# Patient Record
Sex: Female | Born: 1970 | Race: Asian | Hispanic: No | Smoking: Never smoker
Health system: Southern US, Community
[De-identification: ages and names within clinical notes are randomized; demographics above are authoritative.]

## PROBLEM LIST (undated history)

## (undated) DIAGNOSIS — Z789 Other specified health status: Secondary | ICD-10-CM

## (undated) HISTORY — PX: TUBAL LIGATION: SHX77

## (undated) HISTORY — PX: BREAST EXCISIONAL BIOPSY: SUR124

---

## 1998-10-11 ENCOUNTER — Other Ambulatory Visit: Admission: RE | Admit: 1998-10-11 | Discharge: 1998-10-11 | Payer: Self-pay | Admitting: Obstetrics and Gynecology

## 1999-10-17 ENCOUNTER — Other Ambulatory Visit: Admission: RE | Admit: 1999-10-17 | Discharge: 1999-10-17 | Payer: Self-pay | Admitting: Obstetrics and Gynecology

## 2002-01-20 ENCOUNTER — Other Ambulatory Visit: Admission: RE | Admit: 2002-01-20 | Discharge: 2002-01-20 | Payer: Self-pay | Admitting: Obstetrics and Gynecology

## 2002-06-30 ENCOUNTER — Other Ambulatory Visit: Admission: RE | Admit: 2002-06-30 | Discharge: 2002-06-30 | Payer: Self-pay | Admitting: Obstetrics and Gynecology

## 2003-02-12 ENCOUNTER — Other Ambulatory Visit: Admission: RE | Admit: 2003-02-12 | Discharge: 2003-02-12 | Payer: Self-pay | Admitting: Obstetrics and Gynecology

## 2004-02-24 ENCOUNTER — Inpatient Hospital Stay (HOSPITAL_COMMUNITY): Admission: AD | Admit: 2004-02-24 | Discharge: 2004-02-26 | Payer: Self-pay | Admitting: Obstetrics and Gynecology

## 2004-03-23 ENCOUNTER — Other Ambulatory Visit: Admission: RE | Admit: 2004-03-23 | Discharge: 2004-03-23 | Payer: Self-pay | Admitting: Obstetrics and Gynecology

## 2005-04-05 ENCOUNTER — Other Ambulatory Visit: Admission: RE | Admit: 2005-04-05 | Discharge: 2005-04-05 | Payer: Self-pay | Admitting: Obstetrics and Gynecology

## 2007-07-25 ENCOUNTER — Ambulatory Visit (HOSPITAL_COMMUNITY): Admission: RE | Admit: 2007-07-25 | Discharge: 2007-07-25 | Payer: Self-pay | Admitting: Obstetrics and Gynecology

## 2011-01-16 NOTE — Op Note (Signed)
NAMEMATTHEW, PAIS            ACCOUNT NO.:  [id number]   MEDICAL RECORD NO.:  [id number]          PATIENT TYPE:  AMB   LOCATION:  SDC                           FACILITY:  [facility]   PHYSICIAN:  Miguel Aschoff, M.D.       DATE OF BIRTH:  September 05, 1970   DATE OF PROCEDURE:  07/25/2007  DATE OF DISCHARGE:                               OPERATIVE REPORT   PREOPERATIVE DIAGNOSES:  1. Menorrhagia.  2. ParaGard IUD present.  3. Desired sterilization.   FINAL DIAGNOSES:  1. Menorrhagia.  2. ParaGard IUD present.  3. Desired sterilization.   PROCEDURE:  Removal of ParaGard IUD, cervical dilatation, hysteroscopy,  NovaSure endometrial ablation, laparoscopic tubal sterilization using  cautery.   SURGEON:  Dr. Miguel Aschoff.   ANESTHESIA:  General.   COMPLICATIONS:  None.   JUSTIFICATION:  The patient is a 40 year old Asian female who has had  very heavy menses assisted with ParaGard IUD.  The patient would like  treatment for the heavy menses.  In addition, Summer Hayes has expressed prior  for permanent sterilization.  Because of these two problems, plan is for  the patient to undergo removal of her IUD, hysteroscopy followed by  NovaSure endometrial ablation and tubal sterilization.  The risks and  benefits of these procedures were discussed with the patient, and  informed consent has been obtained.   PROCEDURE:  The patient was taken to the operating room and placed in  supine position.  General anesthesia was administered without  difficulty.  Summer Hayes was then placed in the dorsal lithotomy position and  prepped and draped in the usual sterile fashion.  Bladder was  catheterized.  Examination under anesthesia revealed normal external  genitalia, normal Bartholin and Skene's glands, normal urethra.  The  vaginal vault was without lesions.  Uterus was noted be retroflexed,  globular and somewhat irregular in shape, consistent with what appeared  to be small uterine fibroids.  No adnexal masses were  noted.  At this  point, the speculum was placed in the vaginal vault.  The anterior  cervical lip was grasped with the tenaculum, and the endometrial cavity  was sounded.  The total length was 9 cm.  The cervical length was 4 cm  for a cavity length of 5 cm.  Once this was done, the cervix was dilated  using Pratt dilators, and then the diagnostic hysteroscope was advanced  through the endocervix.  No endocervical lesions were noted.  The  endometrial cavity was then visualized.  There did not appear to be any  endometrial polyps, submucous myomas noted.  Once this was done and the  prior ParaGard IUD was removed, the NovaSure endometrial ablation  instrument was introduced.  A cavity width of 4.6 cm was then found, and  then at 127 watts, a treatment cycle of 94 seconds was carried out  without difficulty.  On completion of the treatment cycle, the NovaSure  instrument was removed in toto, and at this point, the hysteroscope was  advanced back into the uterine cavity.  There appeared to be adequate  ablation with good coagulation of the cavity.  At this point, a Hulka  tenaculum was placed through the cervix, and attention was then directed  to the abdomen.  A small infraumbilical incision was made, a Veress  needle was inserted, and then the abdomen was insufflated with 3 liters  CO2.  Following the insufflation, the trocar to laparoscope was placed  followed by laparoscope itself.  Inspection again revealed the uterus to  be globular, slightly irregular with small uterine fibroids.  The tubes  were normal along their course.  The ovaries were normal.  There were no  lesions noted in the cul-de-sac.  The round ligaments were unremarkable.  No hernias were noted.  The liver surface was inspected and was noted to  be within normal limits.  The gallbladder was visualized and appeared to  be normal.  There were no abnormalities noted within the abdomen.  At  this point, through the operating  channel of the laparoscope, bipolar  cautery forceps were introduced.  The midportion of each tube was then  identified and cauterized for approximately 3 cm of the midportion.  After cauterization was carried out, laparoscopic scissors were  introduced, and tubes were divided, and this was done with good  hemostasis.  After this was completed, the laparoscope was removed.  The  CO2 was allowed to escape.  The small infraumbilical incision was closed  using subcuticular 4-0 Vicryl and the port site injected with 5 mL of  0.25% Marcaine.  The estimated blood loss was minimal at the time of the  hysteroscopy and was approximately 10-20 mL.  The patient was then  reversed from the anesthetic and was brought to the recovery room in  satisfactory condition.   Plan is for the patient to be discharged home.  Her medications for home  include Tylox 1 every 3 hours as needed for pain, doxycycline 100 mg  twice a day for 3 days.  Summer Hayes is instructed to leave nothing in the  vagina for 2 weeks, to call for any problems such as fever, pain or  heavy bleeding.  Summer Hayes will be seen back in 4 weeks for follow-up  examination.      Miguel Aschoff, M.D.  Electronically Signed     AR/MEDQ  D:  07/25/2007  T:  07/26/2007  Job:  [id number]

## 2011-06-12 LAB — HCG, SERUM, QUALITATIVE: Preg, Serum: NEGATIVE

## 2011-06-12 LAB — CBC
HCT: 36.5
Hemoglobin: 12.5
MCHC: 34.2
MCV: 85.3
Platelets: 254
RBC: 4.28
RDW: 13.2
WBC: 5

## 2011-06-12 LAB — SEDIMENTATION RATE: Sed Rate: 28 — ABNORMAL HIGH

## 2012-09-05 ENCOUNTER — Other Ambulatory Visit: Payer: Self-pay | Admitting: Family Medicine

## 2012-09-05 DIAGNOSIS — R109 Unspecified abdominal pain: Secondary | ICD-10-CM

## 2012-09-12 ENCOUNTER — Other Ambulatory Visit: Payer: Self-pay

## 2012-10-06 ENCOUNTER — Other Ambulatory Visit: Payer: Self-pay

## 2013-03-31 ENCOUNTER — Other Ambulatory Visit: Payer: Self-pay | Admitting: Family Medicine

## 2013-03-31 DIAGNOSIS — R109 Unspecified abdominal pain: Secondary | ICD-10-CM

## 2013-04-03 ENCOUNTER — Ambulatory Visit
Admission: RE | Admit: 2013-04-03 | Discharge: 2013-04-03 | Disposition: A | Discharge status: Home / Self Care | Payer: Self-pay | Source: Ambulatory Visit | Attending: Family Medicine | Admitting: Family Medicine

## 2013-04-03 DIAGNOSIS — R109 Unspecified abdominal pain: Secondary | ICD-10-CM

## 2014-07-30 IMAGING — US US PELVIS COMPLETE
1 series · 13 of 25 positions shown · non-contrast
Comparison: None

CLINICAL DATA: Left lower quadrant pain.  Vaginal bleeding.
History of tubal ligation.  LMP 5 years prior to this exam due to
implant.



[Series 1: us pelvis complete · 0.35mm/px · 13 of 40 slices shown]
[im 1/40]
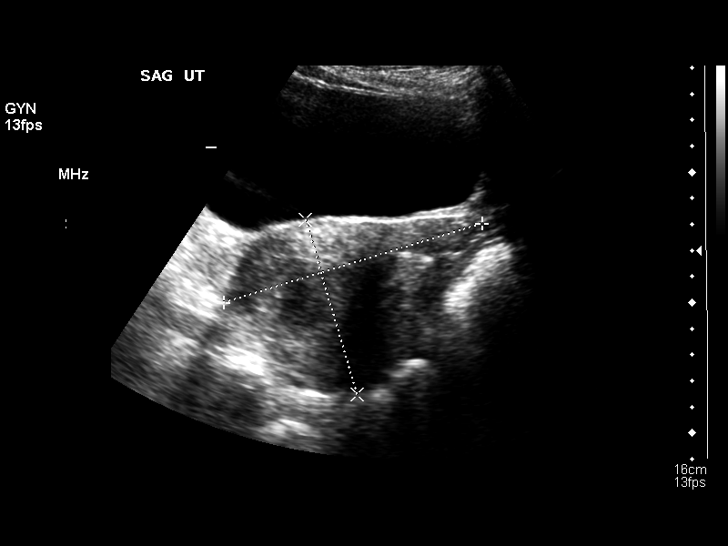
[im 4/40]
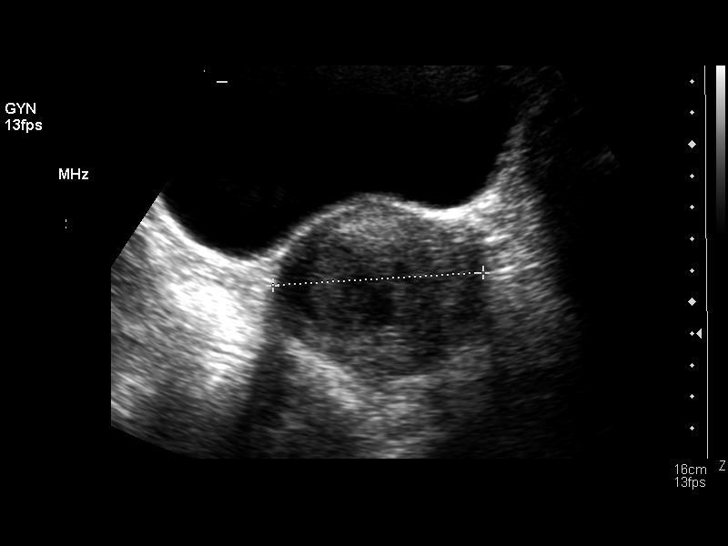
[im 7/40]
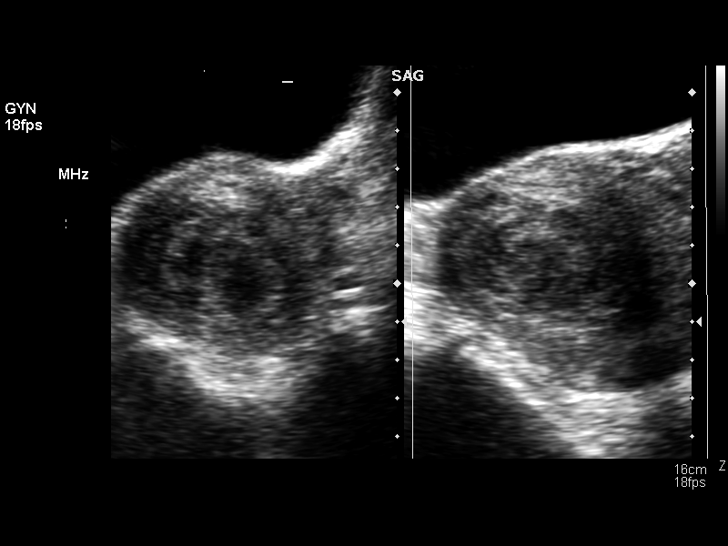
[im 10/40]
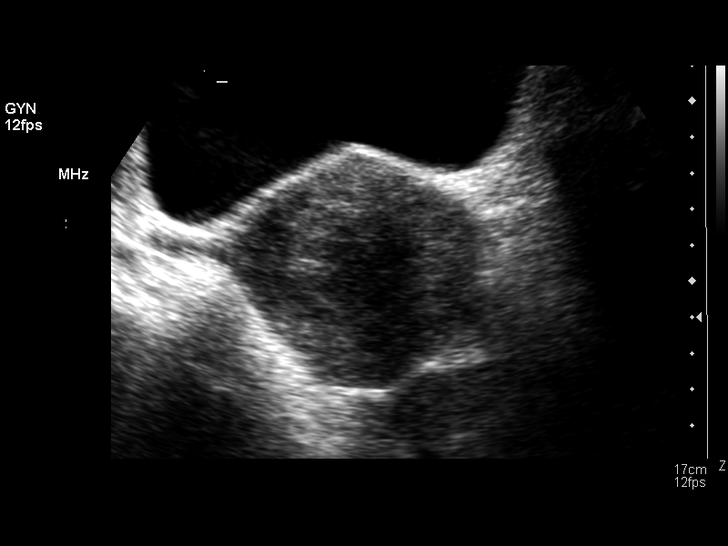
[im 14/40]
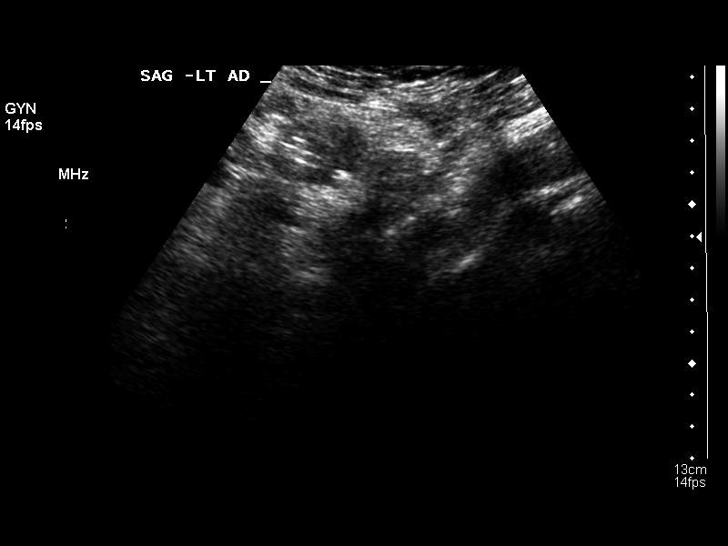
[im 17/40]
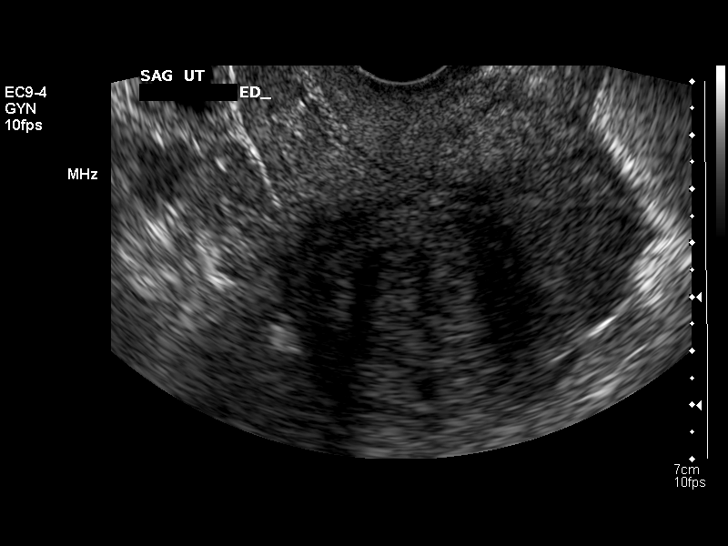
[im 20/40]
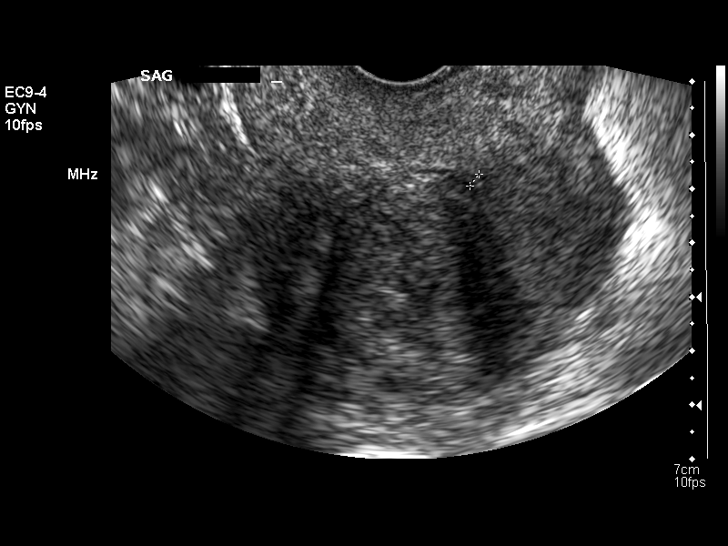
[im 23/40]
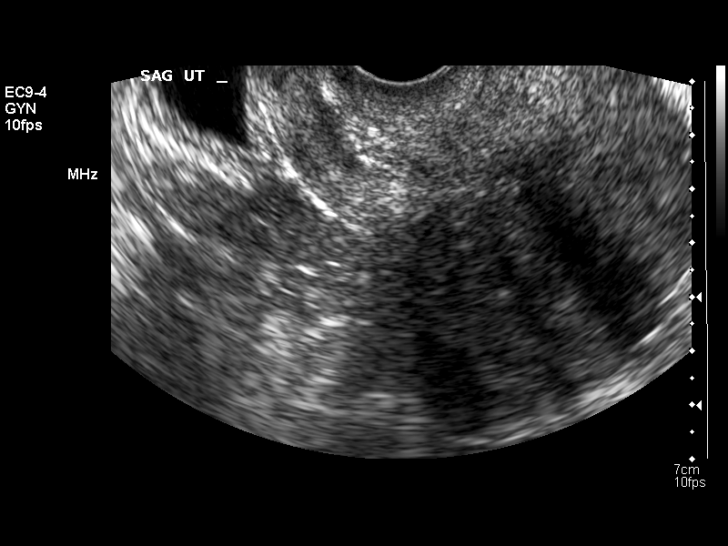
[im 27/40]
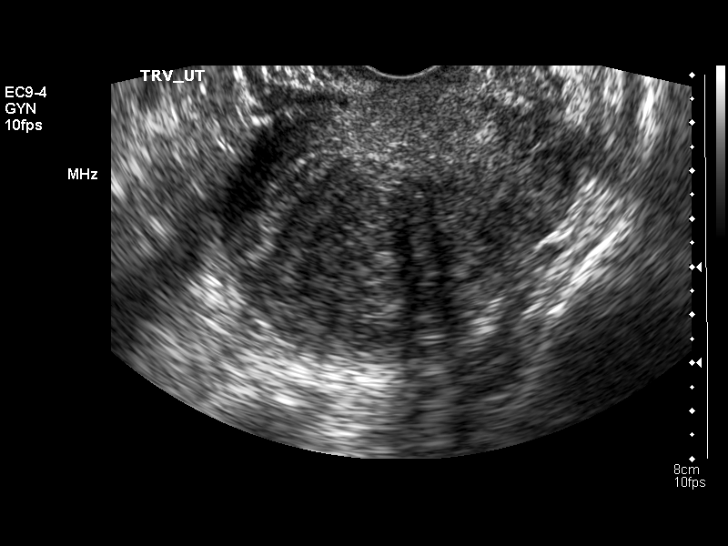
[im 30/40]
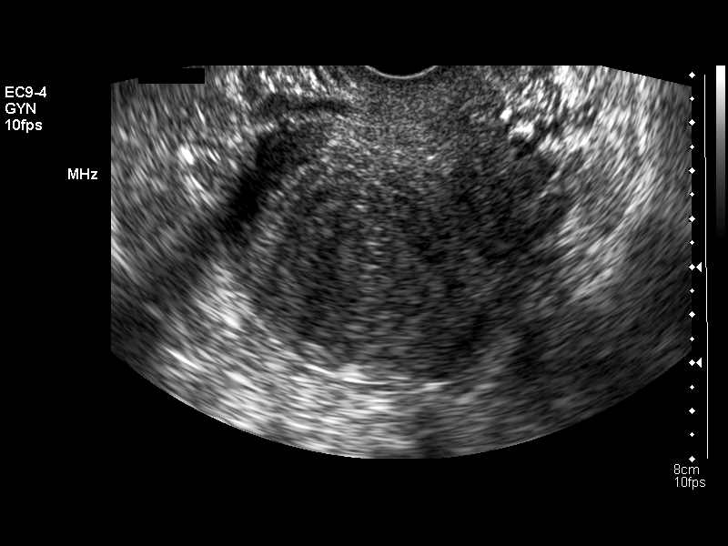
[im 33/40]
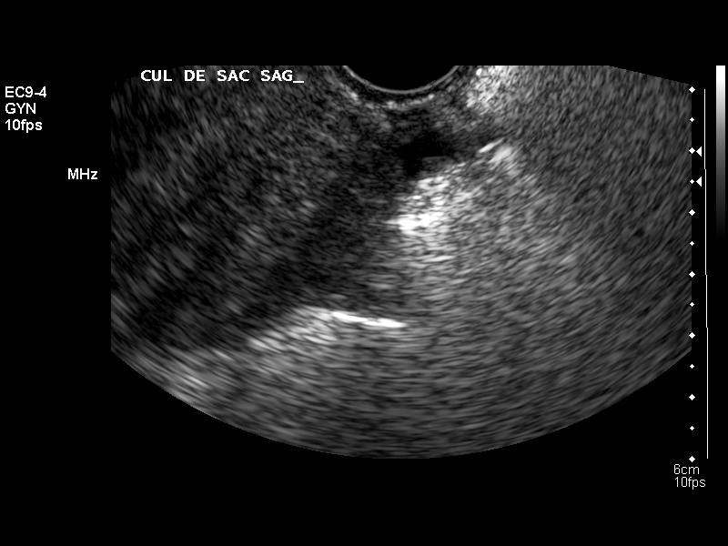
[im 36/40]
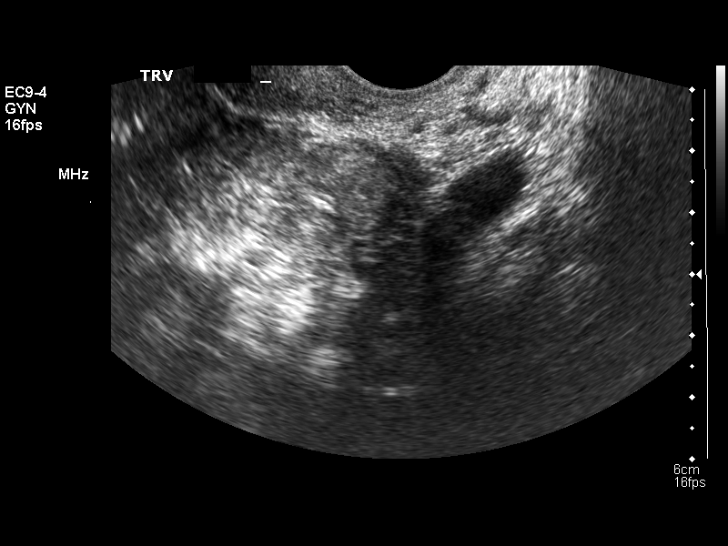
[im 40/40]
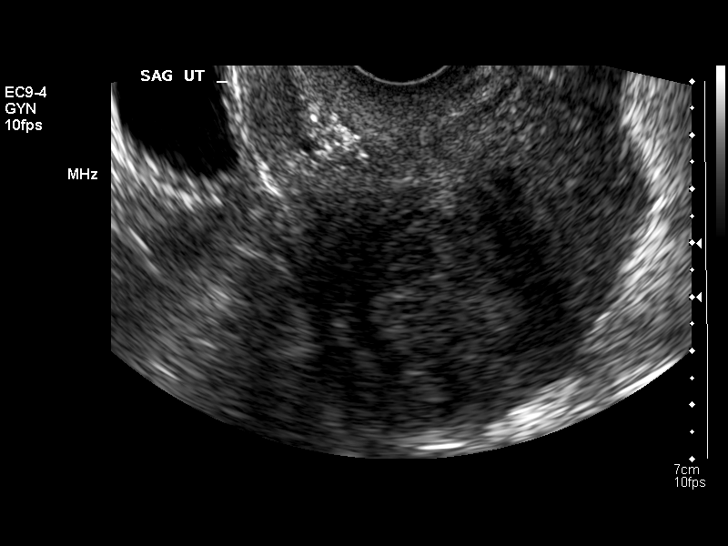

[13 of 25 positions shown; findings below may reference images not displayed]

FINDINGS: Uterus: Is retroverted and retroflexed and demonstrates a sagittal
length of 8.5 cm, depth of 7.3 cm and width of 7.2 cm.  A focal
fibroid is identified in the right anterolateral upper uterine
segment measuring 5.3 x 4.5 x 4.6 cm. This is primarily mural but
has a small subserosal component and deviates the endometrial canal
posteriorly which may indicate a minimal submucosal component as
well. No other mural abnormalities are seen.

Endometrium: A trace of fluid is noted in the fundal portion of the
endometrial canal and outlines a thin single layer of the
endometrium measuring 2 mm.

Right ovary:  Is not seen with confidence either transabdominally
or endovaginally

Left ovary: Is not seen with confidence either transabdominally or
endovaginally

Other findings: A trace of simple free fluid is noted in the cul-de-
sac.
IMPRESSION: Focal fibroid with sizes location as noted above.

Thin endometrium.

Non-visualized ovaries.

## 2014-07-30 IMAGING — US US ABDOMEN COMPLETE
1 series · 14 of 25 positions shown · non-contrast
Comparison: none

Abdominal ultrasound
HISTORY: Abdominal pain

[Series 1: us abdomen complete · 0.32mm/px · 14 of 70 slices shown]
[im 1/70]
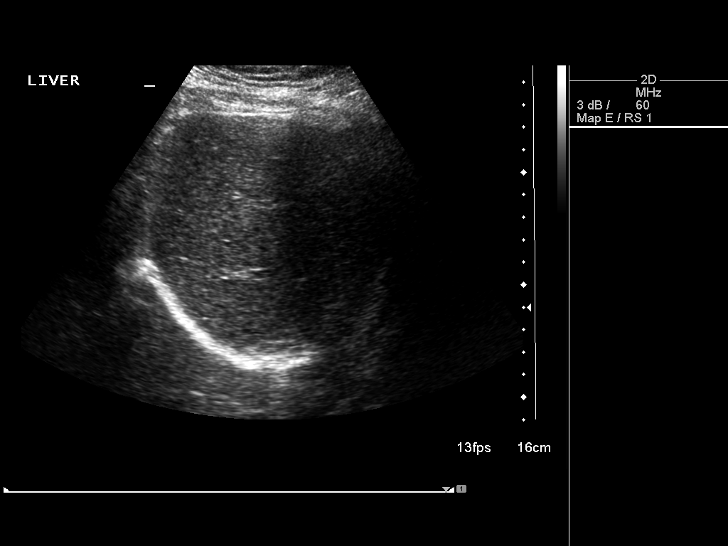
[im 6/70]
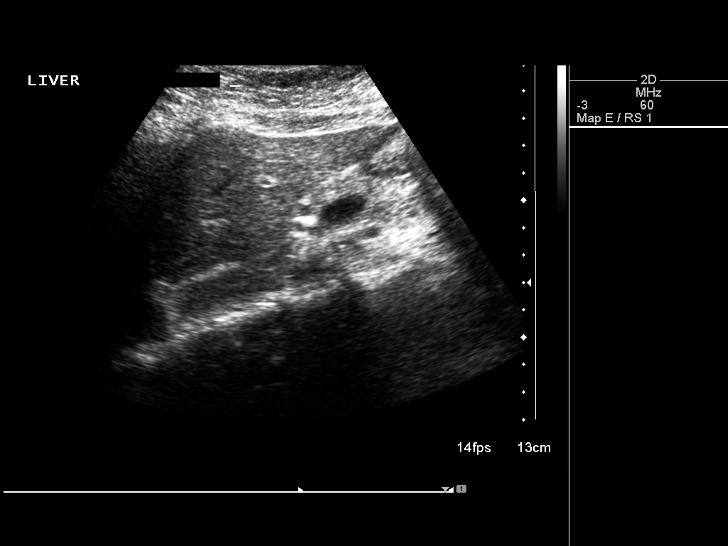
[im 12/70]
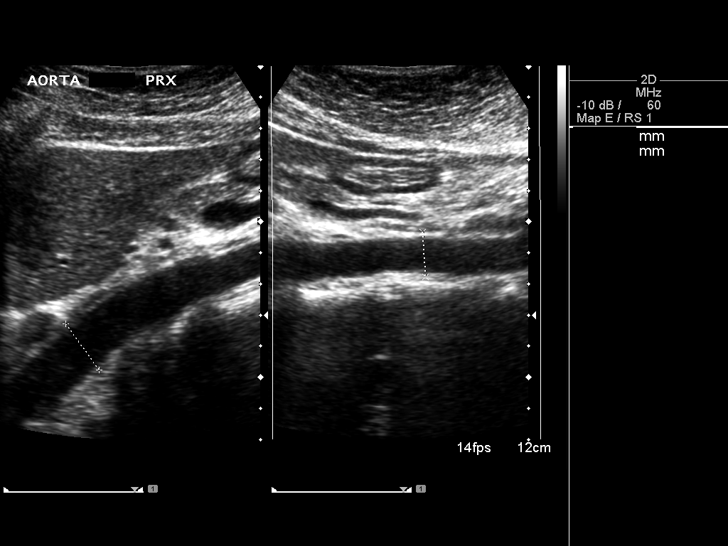
[im 18/70]
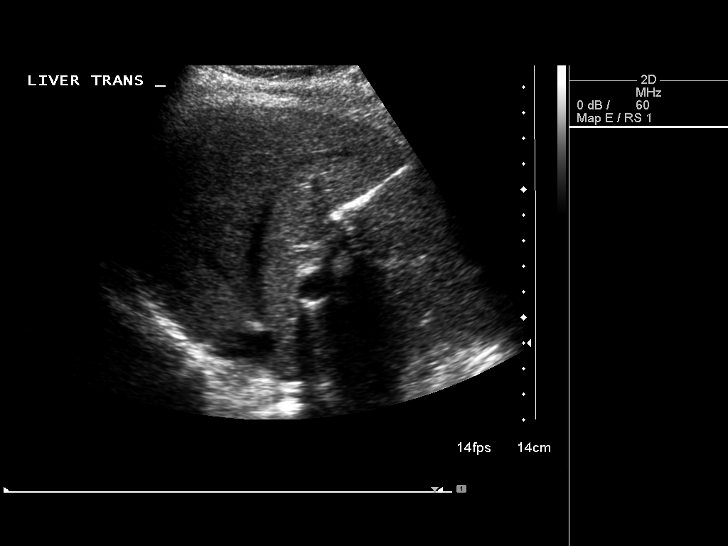
[im 24/70]
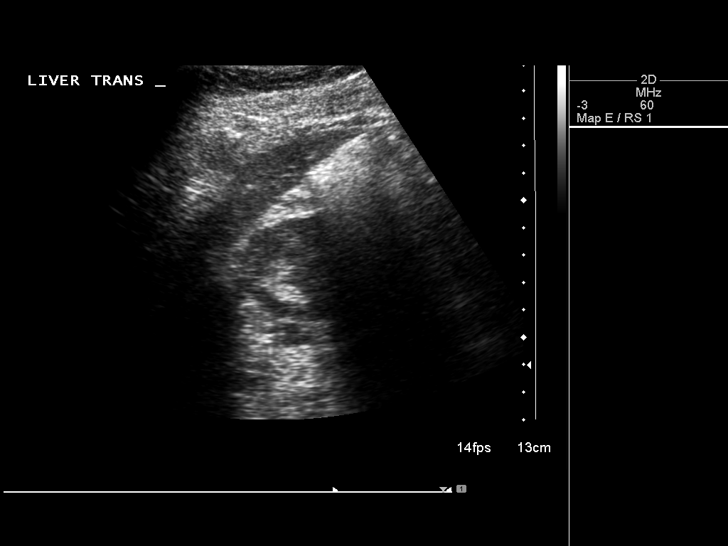
[im 26/70]
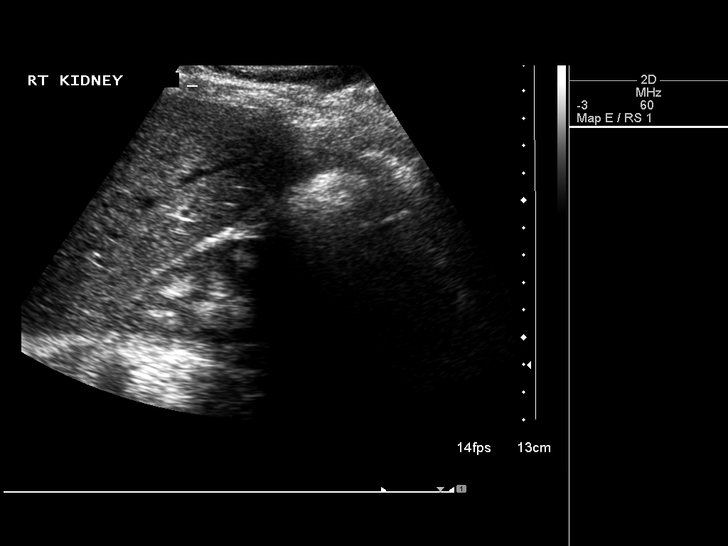
[im 32/70]
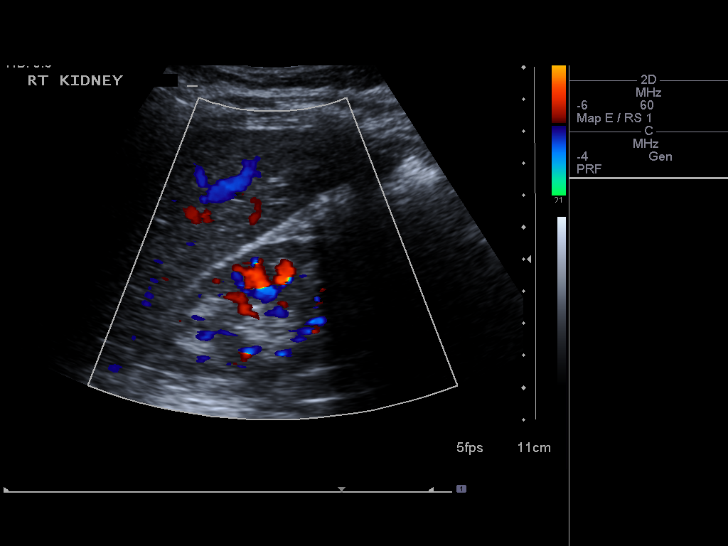
[im 38/70]
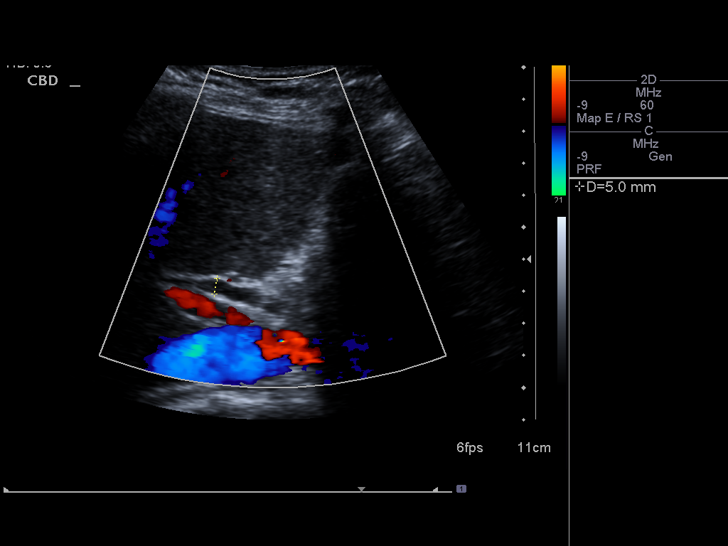
[im 44/70]
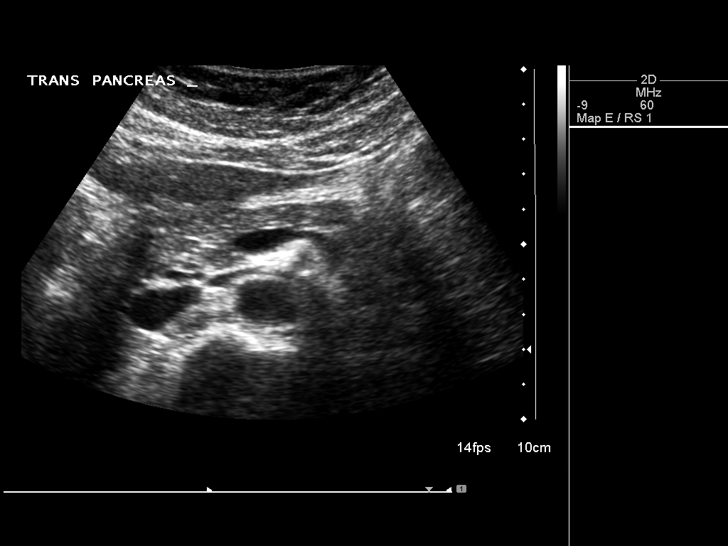
[im 47/70]
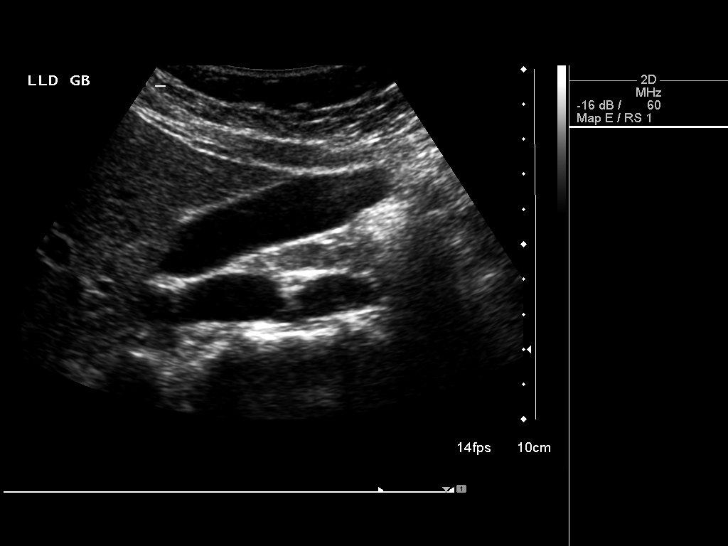
[im 52/70]
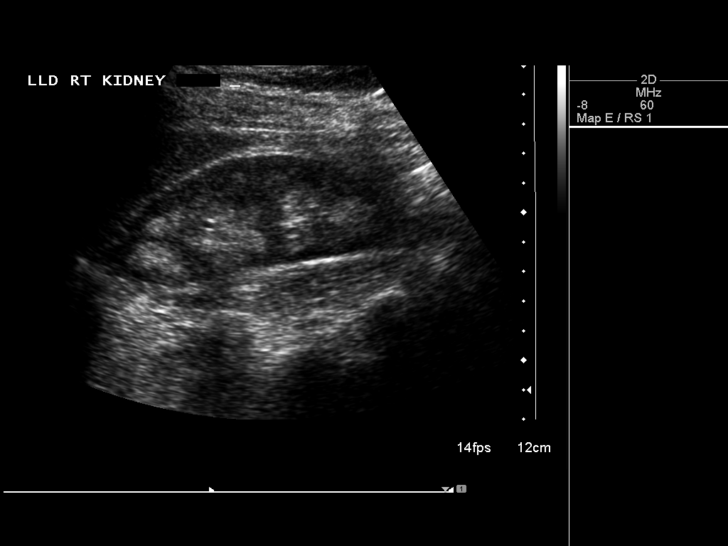
[im 58/70]
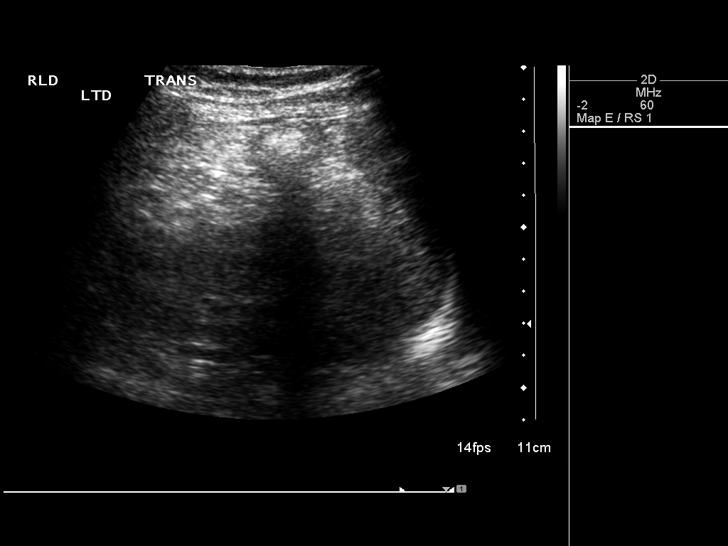
[im 64/70]
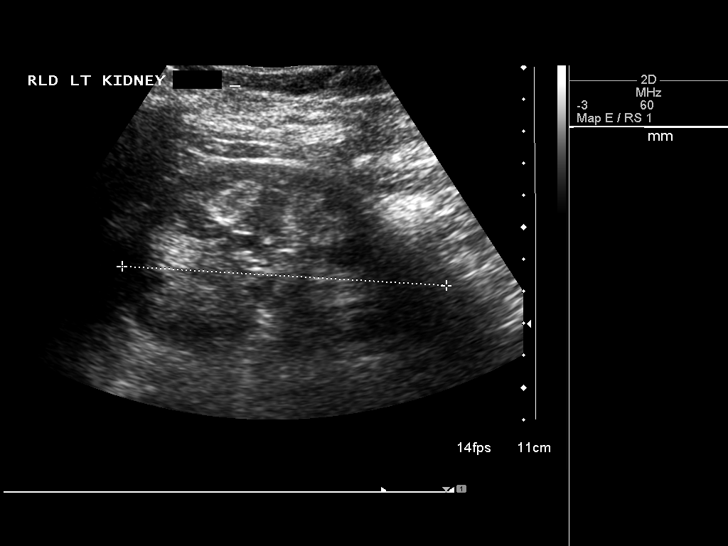
[im 70/70]
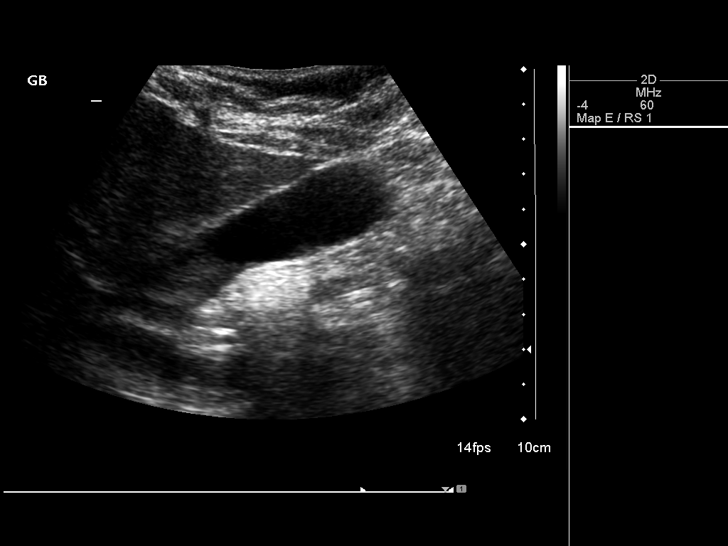

[14 of 25 positions shown; findings below may reference images not displayed]

FINDINGS: Gallbladder is visualized in multiple projections.
There are no gallstones, gallbladder wall thickening, or
pericholecystic fluid collection.  There is no intrahepatic, common
hepatic, or common bile duct dilatation.  The pancreas appears
normal.

No focal liver lesions are identified.  No splenic lesions are
appreciated.  Spleen is normal in size and contour.

Kidneys show no evidence of mass or obstructing focus.  The kidneys
show moderate sinus fat as well as what appears to be some
prominence in the infundibular regions, an appearance which can be
seen with medullary sponge kidney.  No well-defined calculi
identified in either kidney.

There is no ascites.  Aorta is nonaneurysmal.  Inferior vena cava
appears normal.
CONCLUSION: Kidneys have appearance which has been associated with
medullary sponge kidney.  No renal mass or obstructing focus
identified.

Study otherwise unremarkable.

## 2014-11-02 ENCOUNTER — Other Ambulatory Visit: Payer: Self-pay | Admitting: Obstetrics and Gynecology

## 2014-11-02 DIAGNOSIS — R928 Other abnormal and inconclusive findings on diagnostic imaging of breast: Secondary | ICD-10-CM

## 2014-11-15 ENCOUNTER — Other Ambulatory Visit: Payer: Self-pay | Admitting: Obstetrics and Gynecology

## 2014-11-15 ENCOUNTER — Ambulatory Visit
Admission: RE | Admit: 2014-11-15 | Discharge: 2014-11-15 | Disposition: A | Discharge status: Home / Self Care | Payer: Managed Care, Other (non HMO) | Source: Ambulatory Visit | Attending: Obstetrics and Gynecology | Admitting: Obstetrics and Gynecology

## 2014-11-15 DIAGNOSIS — R921 Mammographic calcification found on diagnostic imaging of breast: Secondary | ICD-10-CM

## 2014-11-15 DIAGNOSIS — R928 Other abnormal and inconclusive findings on diagnostic imaging of breast: Secondary | ICD-10-CM

## 2014-11-17 ENCOUNTER — Other Ambulatory Visit: Payer: Self-pay | Admitting: Obstetrics and Gynecology

## 2014-11-17 DIAGNOSIS — R921 Mammographic calcification found on diagnostic imaging of breast: Secondary | ICD-10-CM

## 2014-11-29 ENCOUNTER — Other Ambulatory Visit: Payer: Self-pay | Admitting: Obstetrics and Gynecology

## 2014-11-29 ENCOUNTER — Ambulatory Visit
Admission: RE | Admit: 2014-11-29 | Discharge: 2014-11-29 | Disposition: A | Discharge status: Home / Self Care | Payer: Managed Care, Other (non HMO) | Source: Ambulatory Visit | Attending: Obstetrics and Gynecology | Admitting: Obstetrics and Gynecology

## 2014-11-29 DIAGNOSIS — R921 Mammographic calcification found on diagnostic imaging of breast: Secondary | ICD-10-CM

## 2014-11-29 HISTORY — PX: BREAST BIOPSY: SHX20

## 2015-11-07 ENCOUNTER — Other Ambulatory Visit: Payer: Self-pay | Admitting: Obstetrics and Gynecology

## 2015-11-08 LAB — CYTOLOGY - PAP

## 2015-11-10 ENCOUNTER — Other Ambulatory Visit: Payer: Self-pay | Admitting: Obstetrics and Gynecology

## 2015-11-10 DIAGNOSIS — R928 Other abnormal and inconclusive findings on diagnostic imaging of breast: Secondary | ICD-10-CM

## 2015-11-24 ENCOUNTER — Ambulatory Visit
Admission: RE | Admit: 2015-11-24 | Discharge: 2015-11-24 | Disposition: A | Discharge status: Home / Self Care | Payer: Managed Care, Other (non HMO) | Source: Ambulatory Visit | Attending: Obstetrics and Gynecology | Admitting: Obstetrics and Gynecology

## 2015-11-24 ENCOUNTER — Other Ambulatory Visit: Payer: Self-pay | Admitting: Obstetrics and Gynecology

## 2015-11-24 DIAGNOSIS — R928 Other abnormal and inconclusive findings on diagnostic imaging of breast: Secondary | ICD-10-CM

## 2015-11-24 DIAGNOSIS — N6489 Other specified disorders of breast: Secondary | ICD-10-CM

## 2015-12-12 ENCOUNTER — Ambulatory Visit
Admission: RE | Admit: 2015-12-12 | Discharge: 2015-12-12 | Disposition: A | Discharge status: Home / Self Care | Payer: Managed Care, Other (non HMO) | Source: Ambulatory Visit | Attending: Obstetrics and Gynecology | Admitting: Obstetrics and Gynecology

## 2015-12-12 DIAGNOSIS — N6489 Other specified disorders of breast: Secondary | ICD-10-CM

## 2015-12-12 HISTORY — PX: BREAST BIOPSY: SHX20

## 2015-12-15 ENCOUNTER — Other Ambulatory Visit: Payer: Self-pay | Admitting: General Surgery

## 2015-12-15 DIAGNOSIS — N6091 Unspecified benign mammary dysplasia of right breast: Secondary | ICD-10-CM | POA: Insufficient documentation

## 2015-12-23 ENCOUNTER — Other Ambulatory Visit: Payer: Self-pay | Admitting: General Surgery

## 2015-12-23 DIAGNOSIS — N6091 Unspecified benign mammary dysplasia of right breast: Secondary | ICD-10-CM

## 2016-01-04 ENCOUNTER — Encounter (HOSPITAL_BASED_OUTPATIENT_CLINIC_OR_DEPARTMENT_OTHER): Payer: Self-pay | Admitting: *Deleted

## 2016-01-12 ENCOUNTER — Ambulatory Visit
Admission: RE | Admit: 2016-01-12 | Discharge: 2016-01-12 | Disposition: A | Discharge status: Home / Self Care | Payer: Managed Care, Other (non HMO) | Source: Ambulatory Visit | Attending: General Surgery | Admitting: General Surgery

## 2016-01-12 DIAGNOSIS — N6091 Unspecified benign mammary dysplasia of right breast: Secondary | ICD-10-CM

## 2016-01-12 NOTE — H&P (Signed)
Summer Hayes  Location: [facility] Patient #: [id number] DOB: 1970-12-11 Single / Language: [place] / Race: Asian Female        History of Present Illness   The patient is a 45 year old female who presents with a complaint of Breast problems. This is a 45 year old [place] female. Speaks English very well. Referred by Dr. Si Hayes at the breast center of [facility] for evaluation and management of atypical lobular hyperplasia right breast, upper outer quadrant. No PCP listed.  The patient has a history of a right breast needle core biopsy 1 year ago states this is in the lower outer quadrant and she was told this was benign. Review of the 2016 pathology reveals fibrocystic changes only. This year on her screening mammograms A so a new area of architectural distortion in the upper outer quadrant. This persisted on 3-D imaging. Image guided core biopsy shows atypical lobular hyperplasia, complex sclerosing lesion, usual ductal hyperplasia.  She was called and referred for surgical evaluation. She is fearful that there is an emergency because the appointment was made so soon.. I reassured her that there was not an emergency here.  Family history is negative for breast, ovarian, pancreatic or colon cancer. Mother living with end-stage renal disease hypertension and hyperlipidemia. Father died of primary liver cancer. Patient's past medical history is negative. She is healthy other than the biopsy of the right breast a year ago socially she is single but has 3 children. 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 Denies tobacco. Takes alcohol rarely.  We spent a very long time discussing the imaging findings, her histology report and its implications, and the techniques of surgery. I have advised her to undergo conservative excisional biopsy of this area and she agrees. She'll be scheduled for right breast lumpectomy with radioactive seed localization in the  near future. I discussed the indications, details, techniques, and numerous risk of the surgery with her. She is aware of the risk of bleeding, infection, further surgery if this turns out to be cancer, nerve damage with chronic pain or numbness, cosmetic deformity. She understands all of these issues. All her questions are answered. She agrees with this plan.   Other No pertinent past medical history  Past Surgical History  Breast Biopsy Right.  Diagnostic Studies History Colonoscopy never Mammogram within last year Pap Smear 1-5 years ago  Allergies  No Known Drug Allergies04/13/2017  Medication History  No Current Medications Medications Reconciled  Social History  Alcohol use Occasional alcohol use. Caffeine use Coffee. No drug use Tobacco use Never smoker.  Family History  Hypertension Mother.  Pregnancy / Birth History  Age at menarche 12 years. Gravida 5 Irregular periods Maternal age 69-25 Para 3    Review of Systems  General Not Present- Appetite Loss, Chills, Fatigue, Fever, Night Sweats, Weight Gain and Weight Loss. Skin Not Present- Change in Wart/Mole, Dryness, Hives, Jaundice, New Lesions, Non-Healing Wounds, Rash and Ulcer. HEENT Present- Sore Throat. Not Present- Earache, Hearing Loss, Hoarseness, Nose Bleed, Oral Ulcers, Ringing in the Ears, Seasonal Allergies, Sinus Pain, Visual Disturbances, Wears glasses/contact lenses and Yellow Eyes. Respiratory Not Present- Bloody sputum, Chronic Cough, Difficulty Breathing, Snoring and Wheezing. Breast Not Present- Breast Mass, Breast Pain, Nipple Discharge and Skin Changes. Cardiovascular Not Present- Chest Pain, Difficulty Breathing Lying Down, Leg Cramps, Palpitations, Rapid Heart Rate, Shortness of Breath and Swelling of Extremities. Gastrointestinal Not Present- Abdominal Pain, Bloating, Bloody Stool, Change in Bowel Habits, Chronic diarrhea, Constipation, Difficulty Swallowing,  Excessive gas, Gets full quickly at meals, Hemorrhoids, Indigestion, Nausea, Rectal Pain and Vomiting. Female Genitourinary Not Present- Frequency, Nocturia, Painful Urination, Pelvic Pain and Urgency. Musculoskeletal Not Present- Back Pain, Joint Pain, Joint Stiffness, Muscle Pain, Muscle Weakness and Swelling of Extremities. Neurological Not Present- Decreased Memory, Fainting, Headaches, Numbness, Seizures, Tingling, Tremor, Trouble walking and Weakness. Psychiatric Not Present- Anxiety, Bipolar, Change in Sleep Pattern, Depression, Fearful and Frequent crying. Endocrine Not Present- Cold Intolerance, Excessive Hunger, Hair Changes, Heat Intolerance, Hot flashes and New Diabetes. Hematology Not Present- Easy Bruising, Excessive bleeding, Gland problems, HIV and Persistent Infections.  Vitals  Weight: 108 lb Height: 56.5in Body Surface Area: 1.38 m Body Mass Index: 23.79 kg/m  Temp.: 98.31F  Pulse: 76 (Regular)  BP: 118/76 (Sitting, Left Arm, Standard)    Physical Exam General Mental Status-Alert. General Appearance-Consistent with stated age. Hydration-Well hydrated. Voice-Normal.  Head and Neck Head-normocephalic, atraumatic with no lesions or palpable masses. Trachea-midline. Thyroid Gland Characteristics - normal size and consistency.  Eye Eyeball - Bilateral-Extraocular movements intact. Sclera/Conjunctiva - Bilateral-No scleral icterus.  Chest and Lung Exam Chest and lung exam reveals -quiet, even and easy respiratory effort with no use of accessory muscles and on auscultation, normal breath sounds, no adventitious sounds and normal vocal resonance. Inspection Chest Wall - Normal. Back - normal.  Breast Note: Breasts are small to medium size. Nipple and areolar complexes looked normal. Fresh needle biopsy scar right breast upper outer quadrant. No palpable mass. Needle biopsy scar right breast lower outer quadrant. No other skin change  either breast. No palpable mass in either breast. No axillary adenopathy on either side.   Cardiovascular Cardiovascular examination reveals -normal heart sounds, regular rate and rhythm with no murmurs and normal pedal pulses bilaterally.  Abdomen Inspection Inspection of the abdomen reveals - No Hernias. Skin - Scar - no surgical scars. Palpation/Percussion Palpation and Percussion of the abdomen reveal - Soft, Non Tender, No Rebound tenderness, No Rigidity (guarding) and No hepatosplenomegaly. Auscultation Auscultation of the abdomen reveals - Bowel sounds normal.  Neurologic Neurologic evaluation reveals -alert and oriented x 3 with no impairment of recent or remote memory. Mental Status-Normal.  Musculoskeletal Normal Exam - Left-Upper Extremity Strength Normal and Lower Extremity Strength Normal. Normal Exam - Right-Upper Extremity Strength Normal and Lower Extremity Strength Normal.  Lymphatic Head & Neck  General Head & Neck Lymphatics: Bilateral - Description - Normal. Axillary  General Axillary Region: Bilateral - Description - Normal. Tenderness - Non Tender. Femoral & Inguinal  Generalized Femoral & Inguinal Lymphatics: Bilateral - Description - Normal. Tenderness - Non Tender.    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 This is probably not a breast cancer, but there is a 10% chance that you may have an early in situ cancer I have recommended that this area be conservatively excised with a right breast lumpectomy with radioactive seed localization I've discussed the indications, techniques, and numerous risk of the surgery with you.  You will be scheduled for a right breast lumpectomy with radioactive seed  localization in the near future.    Angelia MouldHaywood M. Derrell LollingIngram, M.D., [facility] General and Minimally invasive Surgery Breast and Colorectal Surgery Office:   [phone] Pager:   [phone]

## 2016-01-12 NOTE — Progress Notes (Signed)
Pt in to get Boost Breeze for surgery in AM.  Instructions reviewed.

## 2016-01-13 ENCOUNTER — Encounter (HOSPITAL_BASED_OUTPATIENT_CLINIC_OR_DEPARTMENT_OTHER): Payer: Self-pay

## 2016-01-13 ENCOUNTER — Ambulatory Visit (HOSPITAL_BASED_OUTPATIENT_CLINIC_OR_DEPARTMENT_OTHER): Payer: Managed Care, Other (non HMO) | Admitting: Anesthesiology

## 2016-01-13 ENCOUNTER — Ambulatory Visit (HOSPITAL_BASED_OUTPATIENT_CLINIC_OR_DEPARTMENT_OTHER)
Admission: RE | Admit: 2016-01-13 | Discharge: 2016-01-13 | Disposition: A | Discharge status: Home / Self Care | Payer: Managed Care, Other (non HMO) | Source: Ambulatory Visit | Attending: General Surgery | Admitting: General Surgery

## 2016-01-13 ENCOUNTER — Encounter (HOSPITAL_BASED_OUTPATIENT_CLINIC_OR_DEPARTMENT_OTHER)
Admission: RE | Disposition: A | Discharge status: Home / Self Care | Payer: Self-pay | Source: Ambulatory Visit | Attending: General Surgery

## 2016-01-13 ENCOUNTER — Ambulatory Visit
Admission: RE | Admit: 2016-01-13 | Discharge: 2016-01-13 | Disposition: A | Discharge status: Home / Self Care | Payer: Managed Care, Other (non HMO) | Source: Ambulatory Visit | Attending: General Surgery | Admitting: General Surgery

## 2016-01-13 DIAGNOSIS — N6091 Unspecified benign mammary dysplasia of right breast: Secondary | ICD-10-CM | POA: Diagnosis present

## 2016-01-13 HISTORY — DX: Other specified health status: Z78.9

## 2016-01-13 HISTORY — PX: HIGH RISK BREAST EXCISION: SHX6773

## 2016-01-13 HISTORY — PX: BREAST LUMPECTOMY WITH RADIOACTIVE SEED LOCALIZATION: SHX6424

## 2016-01-13 SURGERY — BREAST LUMPECTOMY WITH RADIOACTIVE SEED LOCALIZATION
Anesthesia: General | Site: Breast | Laterality: Right

## 2016-01-13 MED ORDER — EPHEDRINE SULFATE-NACL 50-0.9 MG/10ML-% IV SOSY
PREFILLED_SYRINGE | INTRAVENOUS | Status: DC | PRN
  Administered 2016-01-13: 10 mg via INTRAVENOUS

## 2016-01-13 MED ORDER — CEFAZOLIN SODIUM-DEXTROSE 2-4 GM/100ML-% IV SOLN
2.0000 g | INTRAVENOUS | Status: AC
  Administered 2016-01-13: 2 g via INTRAVENOUS

## 2016-01-13 MED ORDER — FENTANYL CITRATE (PF) 100 MCG/2ML IJ SOLN
50.0000 ug | INTRAMUSCULAR | Status: DC | PRN
  Administered 2016-01-13: 50 ug via INTRAVENOUS

## 2016-01-13 MED ORDER — PROPOFOL 10 MG/ML IV BOLUS
INTRAVENOUS | Status: AC
  Filled 2016-01-13: qty 20

## 2016-01-13 MED ORDER — OXYCODONE HCL 5 MG/5ML PO SOLN: 5.0000 mg | Freq: Once | ORAL | Status: DC | PRN

## 2016-01-13 MED ORDER — DEXAMETHASONE SODIUM PHOSPHATE 10 MG/ML IJ SOLN
INTRAMUSCULAR | Status: AC
  Filled 2016-01-13: qty 1

## 2016-01-13 MED ORDER — MIDAZOLAM HCL 2 MG/2ML IJ SOLN
1.0000 mg | INTRAMUSCULAR | Status: DC | PRN
  Administered 2016-01-13: 2 mg via INTRAVENOUS

## 2016-01-13 MED ORDER — GLYCOPYRROLATE 0.2 MG/ML IJ SOLN: 0.2000 mg | Freq: Once | INTRAMUSCULAR | Status: DC | PRN

## 2016-01-13 MED ORDER — OXYCODONE HCL 5 MG PO TABS: 5.0000 mg | ORAL_TABLET | Freq: Once | ORAL | Status: DC | PRN

## 2016-01-13 MED ORDER — BUPIVACAINE-EPINEPHRINE (PF) 0.5% -1:200000 IJ SOLN
INTRAMUSCULAR | Status: DC | PRN
  Administered 2016-01-13: 6 mL

## 2016-01-13 MED ORDER — PROPOFOL 10 MG/ML IV BOLUS
INTRAVENOUS | Status: DC | PRN
Start: 2016-01-13 — End: 2016-01-13
  Administered 2016-01-13: 150 mg via INTRAVENOUS

## 2016-01-13 MED ORDER — CHLORHEXIDINE GLUCONATE 4 % EX LIQD: 1.0000 "application " | Freq: Once | CUTANEOUS | Status: DC

## 2016-01-13 MED ORDER — DEXAMETHASONE SODIUM PHOSPHATE 4 MG/ML IJ SOLN
INTRAMUSCULAR | Status: DC | PRN
  Administered 2016-01-13: 10 mg via INTRAVENOUS

## 2016-01-13 MED ORDER — LIDOCAINE 2% (20 MG/ML) 5 ML SYRINGE
INTRAMUSCULAR | Status: AC
  Filled 2016-01-13: qty 5

## 2016-01-13 MED ORDER — SCOPOLAMINE 1 MG/3DAYS TD PT72: 1.0000 | MEDICATED_PATCH | Freq: Once | TRANSDERMAL | Status: DC | PRN

## 2016-01-13 MED ORDER — MIDAZOLAM HCL 2 MG/2ML IJ SOLN
INTRAMUSCULAR | Status: AC
  Filled 2016-01-13: qty 2

## 2016-01-13 MED ORDER — ONDANSETRON HCL 4 MG/2ML IJ SOLN
INTRAMUSCULAR | Status: DC | PRN
  Administered 2016-01-13: 4 mg via INTRAVENOUS

## 2016-01-13 MED ORDER — LACTATED RINGERS IV SOLN
INTRAVENOUS | Status: DC
  Administered 2016-01-13: 08:00:00 via INTRAVENOUS
  Administered 2016-01-13: 10 mL/h via INTRAVENOUS

## 2016-01-13 MED ORDER — EPHEDRINE 5 MG/ML INJ
INTRAVENOUS | Status: AC
  Filled 2016-01-13: qty 10

## 2016-01-13 MED ORDER — LIDOCAINE 2% (20 MG/ML) 5 ML SYRINGE
INTRAMUSCULAR | Status: DC | PRN
  Administered 2016-01-13: 60 mg via INTRAVENOUS

## 2016-01-13 MED ORDER — HYDROCODONE-ACETAMINOPHEN 5-325 MG PO TABS: 1.0000 | ORAL_TABLET | Freq: Four times a day (QID) | ORAL | Status: AC | PRN

## 2016-01-13 MED ORDER — ONDANSETRON HCL 4 MG/2ML IJ SOLN
INTRAMUSCULAR | Status: AC
  Filled 2016-01-13: qty 2

## 2016-01-13 MED ORDER — FENTANYL CITRATE (PF) 100 MCG/2ML IJ SOLN
INTRAMUSCULAR | Status: AC
  Filled 2016-01-13: qty 2

## 2016-01-13 MED ORDER — FENTANYL CITRATE (PF) 100 MCG/2ML IJ SOLN
25.0000 ug | INTRAMUSCULAR | Status: DC | PRN
  Administered 2016-01-13: 25 ug via INTRAVENOUS
  Administered 2016-01-13: 50 ug via INTRAVENOUS

## 2016-01-13 MED ORDER — CEFAZOLIN SODIUM-DEXTROSE 2-4 GM/100ML-% IV SOLN
INTRAVENOUS | Status: AC
  Filled 2016-01-13: qty 100

## 2016-01-13 MED ORDER — ONDANSETRON HCL 4 MG/2ML IJ SOLN: 4.0000 mg | Freq: Four times a day (QID) | INTRAMUSCULAR | Status: DC | PRN

## 2016-01-13 SURGICAL SUPPLY — 62 items
ADH SKN CLS APL DERMABOND .7 (GAUZE/BANDAGES/DRESSINGS) ×1
APL SKNCLS STERI-STRIP NONHPOA (GAUZE/BANDAGES/DRESSINGS)
APPLIER CLIP 9.375 MED OPEN (MISCELLANEOUS)
APR CLP MED 9.3 20 MLT OPN (MISCELLANEOUS)
BENZOIN TINCTURE PRP APPL 2/3 (GAUZE/BANDAGES/DRESSINGS) IMPLANT
BINDER BREAST LRG (GAUZE/BANDAGES/DRESSINGS) IMPLANT
BINDER BREAST MEDIUM (GAUZE/BANDAGES/DRESSINGS) ×1 IMPLANT
BINDER BREAST XLRG (GAUZE/BANDAGES/DRESSINGS) IMPLANT
BINDER BREAST XXLRG (GAUZE/BANDAGES/DRESSINGS) IMPLANT
BLADE HEX COATED 2.75 (ELECTRODE) ×2 IMPLANT
BLADE SURG 10 STRL SS (BLADE) IMPLANT
BLADE SURG 15 STRL LF DISP TIS (BLADE) ×1 IMPLANT
BLADE SURG 15 STRL SS (BLADE) ×2
CANISTER SUC SOCK COL 7IN (MISCELLANEOUS) IMPLANT
CANISTER SUCT 1200ML W/VALVE (MISCELLANEOUS) ×2 IMPLANT
CHLORAPREP W/TINT 26ML (MISCELLANEOUS) ×2 IMPLANT
CLIP APPLIE 9.375 MED OPEN (MISCELLANEOUS) IMPLANT
COVER BACK TABLE 60X90IN (DRAPES) ×2 IMPLANT
COVER MAYO STAND STRL (DRAPES) ×2 IMPLANT
COVER PROBE W GEL 5X96 (DRAPES) ×2 IMPLANT
DECANTER SPIKE VIAL GLASS SM (MISCELLANEOUS) IMPLANT
DERMABOND ADVANCED (GAUZE/BANDAGES/DRESSINGS) ×1
DERMABOND ADVANCED .7 DNX12 (GAUZE/BANDAGES/DRESSINGS) ×1 IMPLANT
DEVICE DUBIN W/COMP PLATE 8390 (MISCELLANEOUS) ×2 IMPLANT
DRAPE LAPAROSCOPIC ABDOMINAL (DRAPES) ×2 IMPLANT
DRAPE UTILITY XL STRL (DRAPES) ×2 IMPLANT
DRSG PAD ABDOMINAL 8X10 ST (GAUZE/BANDAGES/DRESSINGS) ×1 IMPLANT
ELECT REM PT RETURN 9FT ADLT (ELECTROSURGICAL) ×2
ELECTRODE REM PT RTRN 9FT ADLT (ELECTROSURGICAL) ×1 IMPLANT
GLOVE BIO SURGEON STRL SZ7 (GLOVE) ×2 IMPLANT
GLOVE EUDERMIC 7 POWDERFREE (GLOVE) ×2 IMPLANT
GOWN STRL REUS W/ TWL LRG LVL3 (GOWN DISPOSABLE) ×1 IMPLANT
GOWN STRL REUS W/ TWL XL LVL3 (GOWN DISPOSABLE) ×1 IMPLANT
GOWN STRL REUS W/TWL LRG LVL3 (GOWN DISPOSABLE) ×2
GOWN STRL REUS W/TWL XL LVL3 (GOWN DISPOSABLE) ×2
ILLUMINATOR WAVEGUIDE N/F (MISCELLANEOUS) IMPLANT
KIT MARKER MARGIN INK (KITS) ×2 IMPLANT
LIGHT WAVEGUIDE WIDE FLAT (MISCELLANEOUS) ×1 IMPLANT
NDL HYPO 25X1 1.5 SAFETY (NEEDLE) ×1 IMPLANT
NEEDLE HYPO 25X1 1.5 SAFETY (NEEDLE) ×2 IMPLANT
NS IRRIG 1000ML POUR BTL (IV SOLUTION) ×2 IMPLANT
PACK BASIN DAY SURGERY FS (CUSTOM PROCEDURE TRAY) ×2 IMPLANT
PENCIL BUTTON HOLSTER BLD 10FT (ELECTRODE) ×2 IMPLANT
SHEET MEDIUM DRAPE 40X70 STRL (DRAPES) IMPLANT
SLEEVE SCD COMPRESS KNEE MED (MISCELLANEOUS) ×2 IMPLANT
SPONGE GAUZE 4X4 12PLY STER LF (GAUZE/BANDAGES/DRESSINGS) ×1 IMPLANT
SPONGE LAP 18X18 X RAY DECT (DISPOSABLE) IMPLANT
SPONGE LAP 4X18 X RAY DECT (DISPOSABLE) ×2 IMPLANT
STRIP CLOSURE SKIN 1/2X4 (GAUZE/BANDAGES/DRESSINGS) IMPLANT
SUT ETHILON 3 0 FSL (SUTURE) IMPLANT
SUT MNCRL AB 4-0 PS2 18 (SUTURE) ×2 IMPLANT
SUT SILK 2 0 SH (SUTURE) ×2 IMPLANT
SUT VIC AB 2-0 CT1 27 (SUTURE)
SUT VIC AB 2-0 CT1 TAPERPNT 27 (SUTURE) IMPLANT
SUT VIC AB 3-0 SH 27 (SUTURE)
SUT VIC AB 3-0 SH 27X BRD (SUTURE) IMPLANT
SUT VICRYL 3-0 CR8 SH (SUTURE) ×2 IMPLANT
SYRINGE 10CC LL (SYRINGE) ×2 IMPLANT
TOWEL OR 17X24 6PK STRL BLUE (TOWEL DISPOSABLE) ×2 IMPLANT
TOWEL OR NON WOVEN STRL DISP B (DISPOSABLE) IMPLANT
TUBE CONNECTING 20X1/4 (TUBING) ×2 IMPLANT
YANKAUER SUCT BULB TIP NO VENT (SUCTIONS) ×2 IMPLANT

## 2016-01-13 NOTE — Op Note (Signed)
Patient Name:           Summer Hayes   Date of Surgery:        01/13/2016  Pre op Diagnosis:      Atypical lobular hyperplasia right breast  Post op Diagnosis:    Same  Procedure:                 Right breast lumpectomy with radioactive seed localization and specimen mammogram and margin assessment  Surgeon:                     Edsel Petrin. Dalbert Batman, M.D., FACS  Assistant:                      OR staff  Operative Indications:    This is a 45 year old [name] female. Speaks English very well. Referred by Dr. Melanee Spry at the breast center of [place] for evaluation and management of atypical lobular hyperplasia right breast, upper outer quadrant.       The patient has a history of a right breast needle core biopsy 1 year ago states this is in the lower outer quadrant and she was told this was benign. Review of the 2016 pathology reveals fibrocystic changes only. This year on her screening mammograms A so a new area of architectural distortion in the upper outer quadrant. This persisted on 3-D imaging. Image guided core biopsy shows atypical lobular hyperplasia, complex sclerosing lesion, usual ductal hyperplasia.      She was called and referred for surgical evaluation.       Family history is negative for breast, ovarian, pancreatic or colon cancer.      We spent a very long time discussing the imaging findings, her histology report and its implications, and the techniques of surgery. I have advised her to undergo conservative excisional biopsy of this area and she agrees. She'll be scheduled for right breast lumpectomy with radioactive seed localization in the near future. I discussed the indications, details, techniques, and numerous risk of the surgery with her. She is aware of the risk of bleeding, infection, further surgery if this turns out to be cancer, nerve damage with chronic pain or numbness, cosmetic deformity. She understands all of these issues. All her questions are  answered. She agrees with this plan.  Operative Findings:       The radioactive seed was in the lateral right breast very close to the pectoralis muscle.  I was able to excise this through a lateral low axillary incision at the edge of the breast to hide the scar.  The specimen mammogram looked good containing both the seed and a marker clip.  I found the radioactive seed in the posterior edge of the specimen right on the pectoralis fashion.  I actually had to suture the seed back in place on the posterior margin.  I discussed this with pathology  Procedure in Detail:          Following the induction of general LMA anesthesia the patient's right breast was prepped and draped in a sterile fashion, surgical timeout was performed, intravenous antibiotics were given.  I used the neoprobe to identify the location of the radioactive seed which was really at about the 3:00 position and was about equidistant from her areola and her lateral breast edge.  Her areola was very small and not suitable for a circumareolar incision.  0.5% Marcaine with epinephrine was used as local infiltration anesthetic.  A curvilinear  incision was made laterally at the breast edge.  Using the lighted Invuity retractor I dissected medially until I could get past the radioactivity and then took the dissection down to the pectoralis fascia.  As I brought the specimen laterally I saw the seed embedded in the posterior margin and sutured this in place.  The specimen was marked with silk sutures.  I further resected the specimen and then marked it further with a 6 color ink kit.    Specimen mammogram looked good as described above in the specimen was marked and sent to the lab.  Hemostasis excellent and achieved with electrocautery.  Wound was irrigated with saline.  The deeper breast tissues were closed with 3-0 Vicryl sutures and skin closed with a running subcuticular 4-0 Monocryl and Dermabond.  Breast binder was placed and the patient taken  to PACU in stable condition.  EBL 15 mL.  Counts correct.  Complications none.     Edsel Petrin. Dalbert Batman, M.D., FACS General and Minimally Invasive Surgery Breast and Colorectal Surgery  01/13/2016 10:30 AM

## 2016-01-13 NOTE — Interval H&P Note (Signed)
History and Physical Interval Note:  01/13/2016 9:06 AM  Summer Hayes  has presented today for surgery, with the diagnosis of atypical lobular hyperplasia right breast, upper outer quadrant  The various methods of treatment have been discussed with the patient and family. After consideration of risks, benefits and other options for treatment, the patient has consented to  Procedure(s): RIGHT BREAST LUMPECTOMY WITH RADIOACTIVE SEED LOCALIZATION (Right) as a surgical intervention .  The patient's history has been reviewed, patient examined, no change in status, stable for surgery.  I have reviewed the patient's chart and labs.  Questions were answered to the patient's satisfaction.     Ernestene MentionINGRAM,Hitoshi Werts M

## 2016-01-13 NOTE — Anesthesia Procedure Notes (Signed)
Procedure Name: LMA Insertion Date/Time: 01/13/2016 9:46 AM Performed by: Gar GibbonKEETON, Bartley Vuolo S Pre-anesthesia Checklist: Patient identified, Emergency Drugs available, Suction available and Patient being monitored Patient Re-evaluated:Patient Re-evaluated prior to inductionOxygen Delivery Method: Circle System Utilized Preoxygenation: Pre-oxygenation with 100% oxygen Intubation Type: IV induction Ventilation: Mask ventilation without difficulty LMA: LMA inserted LMA Size: 3.0 Number of attempts: 1 Airway Equipment and Method: Bite block Placement Confirmation: positive ETCO2 Tube secured with: Tape Dental Injury: Teeth and Oropharynx as per pre-operative assessment

## 2016-01-13 NOTE — Transfer of Care (Signed)
Immediate Anesthesia Transfer of Care Note  Patient: Summer Hayes  Procedure(s) Performed: Procedure(s): RIGHT BREAST LUMPECTOMY WITH RADIOACTIVE SEED LOCALIZATION (Right)  Patient Location: PACU  Anesthesia Type:General  Level of Consciousness: awake, sedated and patient cooperative  Airway & Oxygen Therapy: Patient Spontanous Breathing and Patient connected to face mask oxygen  Post-op Assessment: Report given to RN and Post -op Vital signs reviewed and stable  Post vital signs: Reviewed and stable  Last Vitals:  Filed Vitals:   01/13/16 0751  BP: 126/84  Pulse: 73  Temp: 36.7 C  Resp: 16    Last Pain: There were no vitals filed for this visit.       Complications: No apparent anesthesia complications

## 2016-01-13 NOTE — Discharge Instructions (Signed)
[facility] °Office Phone Number [phone] ° °BREAST BIOPSY/ PARTIAL MASTECTOMY: POST OP INSTRUCTIONS ° °Always review your discharge instruction sheet given to you by the facility where your surgery was performed. ° °IF YOU HAVE DISABILITY OR FAMILY LEAVE FORMS, YOU MUST BRING THEM TO THE OFFICE FOR PROCESSING.  DO NOT GIVE THEM TO YOUR DOCTOR. ° °1. A prescription for pain medication may be given to you upon discharge.  Take your pain medication as prescribed, if needed.  If narcotic pain medicine is not needed, then you may take acetaminophen (Tylenol) or ibuprofen (Advil) as needed. °2. Take your usually prescribed medications unless otherwise directed °3. If you need a refill on your pain medication, please contact your pharmacy.  They will contact our office to request authorization.  Prescriptions will not be filled after 5pm or on week-ends. °4. You should eat very light the first 24 hours after surgery, such as soup, crackers, pudding, etc.  Resume your normal diet the day after surgery. °5. Most patients will experience some swelling and bruising in the breast.  Ice packs and a good support bra will help.  Swelling and bruising can take several days to resolve.  °6. It is common to experience some constipation if taking pain medication after surgery.  Increasing fluid intake and taking a stool softener will usually help or prevent this problem from occurring.  A mild laxative (Milk of Magnesia or Miralax) should be taken according to package directions if there are no bowel movements after 48 hours. °7. Unless discharge instructions indicate otherwise, you may remove your bandages 24-48 hours after surgery, and you may shower at that time.  You may have steri-strips (small skin tapes) in place directly over the incision.  These strips should be left on the skin for 7-10 days.  If your surgeon used skin glue on the incision, you may shower in 24 hours.  The glue will flake off over the  next 2-3 weeks.  Any sutures or staples will be removed at the office during your follow-up visit. °8. ACTIVITIES:  You may resume regular daily activities (gradually increasing) beginning the next day.  Wearing a good support bra or sports bra minimizes pain and swelling.  You may have sexual intercourse when it is comfortable. °a. You may drive when you no longer are taking prescription pain medication, you can comfortably wear a seatbelt, and you can safely maneuver your car and apply brakes. °b. RETURN TO WORK:  ______________________________________________________________________________________ °9. You should see your doctor in the office for a follow-up appointment approximately two weeks after your surgery.  Your doctor’s nurse will typically make your follow-up appointment when she calls you with your pathology report.  Expect your pathology report 2-3 business days after your surgery.  You may call to check if you do not hear from us after three days. °10. OTHER INSTRUCTIONS: _______________________________________________________________________________________________ _____________________________________________________________________________________________________________________________________ °_____________________________________________________________________________________________________________________________________ °_____________________________________________________________________________________________________________________________________ ° °WHEN TO CALL YOUR DOCTOR: °1. Fever over 101.0 °2. Nausea and/or vomiting. °3. Extreme swelling or bruising. °4. Continued bleeding from incision. °5. Increased pain, redness, or drainage from the incision. ° °The clinic staff is available to answer your questions during regular business hours.  Please don’t hesitate to call and ask to speak to one of the nurses for clinical concerns.  If you have a medical emergency, go to the nearest  emergency room or call 911.  A surgeon from [facility] is always on call at the hospital. ° °For further questions, please visit [email or web address]  ° ° ° °  Post Anesthesia Home Care Instructions ° °Activity: °Get plenty of rest for the remainder of the day. A responsible adult should stay with you for 24 hours following the procedure.  °For the next 24 hours, DO NOT: °-Drive a car °-Operate machinery °-Drink alcoholic beverages °-Take any medication unless instructed by your physician °-Make any legal decisions or sign important papers. ° °Meals: °Start with liquid foods such as gelatin or soup. Progress to regular foods as tolerated. Avoid greasy, spicy, heavy foods. If nausea and/or vomiting occur, drink only clear liquids until the nausea and/or vomiting subsides. Call your physician if vomiting continues. ° °Special Instructions/Symptoms: °Your throat may feel dry or sore from the anesthesia or the breathing tube placed in your throat during surgery. If this causes discomfort, gargle with warm salt water. The discomfort should disappear within 24 hours. ° °If you had a scopolamine patch placed behind your ear for the management of post- operative nausea and/or vomiting: ° °1. The medication in the patch is effective for 72 hours, after which it should be removed.  Wrap patch in a tissue and discard in the trash. Wash hands thoroughly with soap and water. °2. You may remove the patch earlier than 72 hours if you experience unpleasant side effects which may include dry mouth, dizziness or visual disturbances. °3. Avoid touching the patch. Wash your hands with soap and water after contact with the patch. °  ° °

## 2016-01-13 NOTE — Anesthesia Preprocedure Evaluation (Signed)
Anesthesia Evaluation  Patient identified by MRN, date of birth, ID band Patient awake    Reviewed: Allergy & Precautions, H&P , NPO status , Patient's Chart, lab work & pertinent test results  Airway Mallampati: II   Neck ROM: full    Dental   Pulmonary neg pulmonary ROS,    breath sounds clear to auscultation       Cardiovascular negative cardio ROS   Rhythm:regular Rate:Normal     Neuro/Psych    GI/Hepatic   Endo/Other    Renal/GU      Musculoskeletal   Abdominal   Peds  Hematology   Anesthesia Other Findings   Reproductive/Obstetrics                             Anesthesia Physical Anesthesia Plan  ASA: I  Anesthesia Plan: General   Post-op Pain Management:    Induction: Intravenous  Airway Management Planned: LMA  Additional Equipment:   Intra-op Plan:   Post-operative Plan:   Informed Consent: I have reviewed the patients History and Physical, chart, labs and discussed the procedure including the risks, benefits and alternatives for the proposed anesthesia with the patient or authorized representative who has indicated his/her understanding and acceptance.     Plan Discussed with: CRNA, Anesthesiologist and Surgeon  Anesthesia Plan Comments:         Anesthesia Quick Evaluation  

## 2016-01-16 ENCOUNTER — Encounter (HOSPITAL_BASED_OUTPATIENT_CLINIC_OR_DEPARTMENT_OTHER): Payer: Self-pay | Admitting: General Surgery

## 2016-01-16 NOTE — Anesthesia Postprocedure Evaluation (Signed)
Anesthesia Post Note  Patient: Summer Hayes  Procedure(s) Performed: Procedure(s) (LRB): RIGHT BREAST LUMPECTOMY WITH RADIOACTIVE SEED LOCALIZATION (Right)  Patient location during evaluation: PACU Anesthesia Type: General Level of consciousness: awake and alert and patient cooperative Pain management: pain level controlled Vital Signs Assessment: post-procedure vital signs reviewed and stable Respiratory status: spontaneous breathing and respiratory function stable Cardiovascular status: stable Anesthetic complications: no    Last Vitals:  Filed Vitals:   01/13/16 1145 01/13/16 1217  BP: 151/86 148/88  Pulse: 91 88  Temp:  36.6 C  Resp: 19 18    Last Pain:  Filed Vitals:   01/16/16 0943  PainSc: 2                  Casha Estupinan S

## 2016-01-19 NOTE — Progress Notes (Signed)
Quick Note:  Inform patient of Pathology report ,.tell her report came in late last night. Completely benign. Hyperplasia but no atypia. Good news. Nothing further needs to be done. Will review pathology report and give her a copy at next office visit.   hmi ______

## 2016-11-06 ENCOUNTER — Other Ambulatory Visit: Payer: Self-pay | Admitting: Obstetrics and Gynecology

## 2016-11-06 DIAGNOSIS — Z1231 Encounter for screening mammogram for malignant neoplasm of breast: Secondary | ICD-10-CM

## 2016-11-12 ENCOUNTER — Other Ambulatory Visit: Payer: Self-pay | Admitting: Obstetrics and Gynecology

## 2016-11-14 LAB — CYTOLOGY - PAP

## 2016-12-03 ENCOUNTER — Ambulatory Visit
Admission: RE | Admit: 2016-12-03 | Discharge: 2016-12-03 | Disposition: A | Discharge status: Home / Self Care | Payer: Managed Care, Other (non HMO) | Source: Ambulatory Visit | Attending: Obstetrics and Gynecology | Admitting: Obstetrics and Gynecology

## 2016-12-03 DIAGNOSIS — Z1231 Encounter for screening mammogram for malignant neoplasm of breast: Secondary | ICD-10-CM

## 2017-10-08 ENCOUNTER — Other Ambulatory Visit: Payer: Self-pay | Admitting: Obstetrics and Gynecology

## 2017-10-08 DIAGNOSIS — Z139 Encounter for screening, unspecified: Secondary | ICD-10-CM

## 2017-12-04 ENCOUNTER — Ambulatory Visit
Admission: RE | Admit: 2017-12-04 | Discharge: 2017-12-04 | Disposition: A | Discharge status: Home / Self Care | Payer: 59 | Source: Ambulatory Visit | Attending: Obstetrics and Gynecology | Admitting: Obstetrics and Gynecology

## 2017-12-04 DIAGNOSIS — Z139 Encounter for screening, unspecified: Secondary | ICD-10-CM

## 2017-12-05 ENCOUNTER — Other Ambulatory Visit: Payer: Self-pay | Admitting: Obstetrics and Gynecology

## 2017-12-05 DIAGNOSIS — R928 Other abnormal and inconclusive findings on diagnostic imaging of breast: Secondary | ICD-10-CM

## 2017-12-09 ENCOUNTER — Other Ambulatory Visit: Payer: 59

## 2017-12-10 ENCOUNTER — Ambulatory Visit
Admission: RE | Admit: 2017-12-10 | Discharge: 2017-12-10 | Disposition: A | Discharge status: Home / Self Care | Payer: 59 | Source: Ambulatory Visit | Attending: Obstetrics and Gynecology | Admitting: Obstetrics and Gynecology

## 2017-12-10 ENCOUNTER — Ambulatory Visit: Payer: 59

## 2017-12-10 DIAGNOSIS — R928 Other abnormal and inconclusive findings on diagnostic imaging of breast: Secondary | ICD-10-CM

## 2018-10-03 ENCOUNTER — Other Ambulatory Visit: Payer: Self-pay | Admitting: Obstetrics and Gynecology

## 2018-10-03 DIAGNOSIS — Z1231 Encounter for screening mammogram for malignant neoplasm of breast: Secondary | ICD-10-CM

## 2018-12-15 ENCOUNTER — Ambulatory Visit: Payer: 59

## 2019-01-27 ENCOUNTER — Other Ambulatory Visit: Payer: Self-pay

## 2019-01-27 ENCOUNTER — Ambulatory Visit
Admission: RE | Admit: 2019-01-27 | Discharge: 2019-01-27 | Disposition: A | Discharge status: Home / Self Care | Payer: 59 | Source: Ambulatory Visit | Attending: Obstetrics and Gynecology | Admitting: Obstetrics and Gynecology

## 2019-01-27 DIAGNOSIS — Z1231 Encounter for screening mammogram for malignant neoplasm of breast: Secondary | ICD-10-CM

## 2019-12-02 ENCOUNTER — Other Ambulatory Visit: Payer: Self-pay | Admitting: Obstetrics and Gynecology

## 2019-12-02 DIAGNOSIS — Z1231 Encounter for screening mammogram for malignant neoplasm of breast: Secondary | ICD-10-CM

## 2020-01-28 ENCOUNTER — Ambulatory Visit
Admission: RE | Admit: 2020-01-28 | Discharge: 2020-01-28 | Disposition: A | Discharge status: Home / Self Care | Payer: 59 | Source: Ambulatory Visit | Attending: Obstetrics and Gynecology | Admitting: Obstetrics and Gynecology

## 2020-01-28 ENCOUNTER — Other Ambulatory Visit: Payer: Self-pay

## 2020-01-28 DIAGNOSIS — Z1231 Encounter for screening mammogram for malignant neoplasm of breast: Secondary | ICD-10-CM

## 2020-11-25 ENCOUNTER — Other Ambulatory Visit: Payer: Self-pay | Admitting: Obstetrics and Gynecology

## 2020-11-25 DIAGNOSIS — Z1231 Encounter for screening mammogram for malignant neoplasm of breast: Secondary | ICD-10-CM

## 2021-01-31 ENCOUNTER — Ambulatory Visit
Admission: RE | Admit: 2021-01-31 | Discharge: 2021-01-31 | Disposition: A | Discharge status: Home / Self Care | Payer: 59 | Source: Ambulatory Visit | Attending: Obstetrics and Gynecology | Admitting: Obstetrics and Gynecology

## 2021-01-31 ENCOUNTER — Other Ambulatory Visit: Payer: Self-pay

## 2021-01-31 DIAGNOSIS — Z1231 Encounter for screening mammogram for malignant neoplasm of breast: Secondary | ICD-10-CM

## 2021-03-17 ENCOUNTER — Ambulatory Visit: Payer: Self-pay | Admitting: Allergy

## 2021-12-05 ENCOUNTER — Other Ambulatory Visit: Payer: Self-pay | Admitting: Obstetrics and Gynecology

## 2021-12-05 DIAGNOSIS — Z1231 Encounter for screening mammogram for malignant neoplasm of breast: Secondary | ICD-10-CM

## 2022-02-01 ENCOUNTER — Ambulatory Visit
Admission: RE | Admit: 2022-02-01 | Discharge: 2022-02-01 | Disposition: A | Discharge status: Home / Self Care | Payer: 59 | Source: Ambulatory Visit | Attending: Obstetrics and Gynecology | Admitting: Obstetrics and Gynecology

## 2022-02-01 DIAGNOSIS — Z1231 Encounter for screening mammogram for malignant neoplasm of breast: Secondary | ICD-10-CM

## 2022-07-24 ENCOUNTER — Other Ambulatory Visit (HOSPITAL_COMMUNITY): Payer: Self-pay | Admitting: Family Medicine

## 2022-07-24 DIAGNOSIS — E7800 Pure hypercholesterolemia, unspecified: Secondary | ICD-10-CM

## 2022-08-02 ENCOUNTER — Ambulatory Visit (HOSPITAL_BASED_OUTPATIENT_CLINIC_OR_DEPARTMENT_OTHER)
Admission: RE | Admit: 2022-08-02 | Discharge: 2022-08-02 | Disposition: A | Discharge status: Home / Self Care | Payer: 59 | Source: Ambulatory Visit | Attending: Family Medicine | Admitting: Family Medicine

## 2022-08-02 DIAGNOSIS — E7800 Pure hypercholesterolemia, unspecified: Secondary | ICD-10-CM | POA: Insufficient documentation

## 2022-08-14 IMAGING — MG MM DIGITAL SCREENING BILAT W/ TOMO AND CAD
8 series · 9 of 24 positions shown · non-contrast
Comparison: Previous exam(s).

CLINICAL DATA: Screening.

EXAM:
DIGITAL SCREENING BILATERAL MAMMOGRAM WITH TOMOSYNTHESIS AND CAD
TECHNIQUE: Bilateral screening digital craniocaudal and mediolateral oblique
mammograms were obtained. Bilateral screening digital breast
tomosynthesis was performed. The images were evaluated with
computer-aided detection.

[R CC synth-2D]
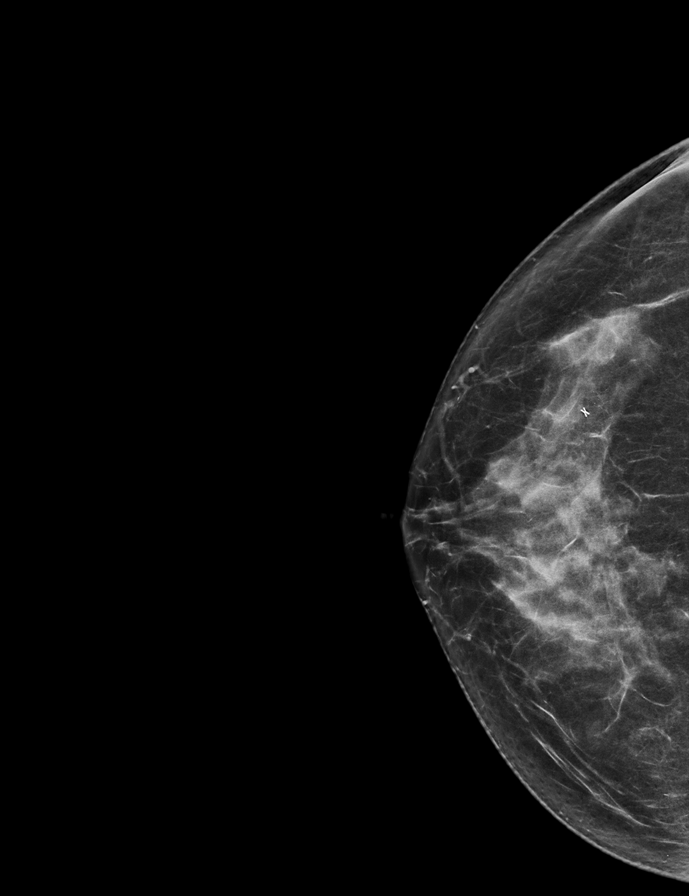

[L CC synth-2D]
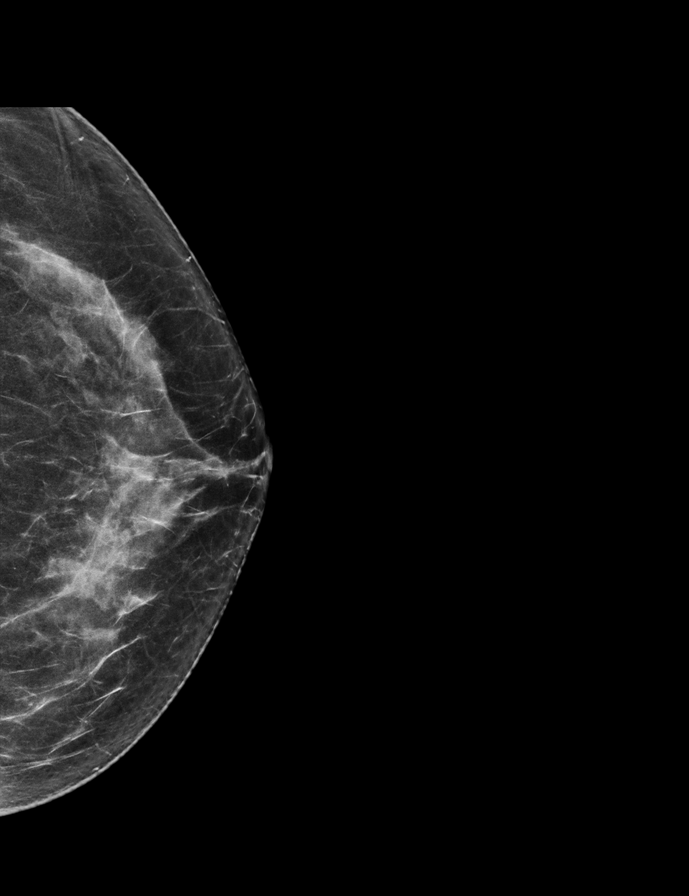

[L MLO synth-2D]
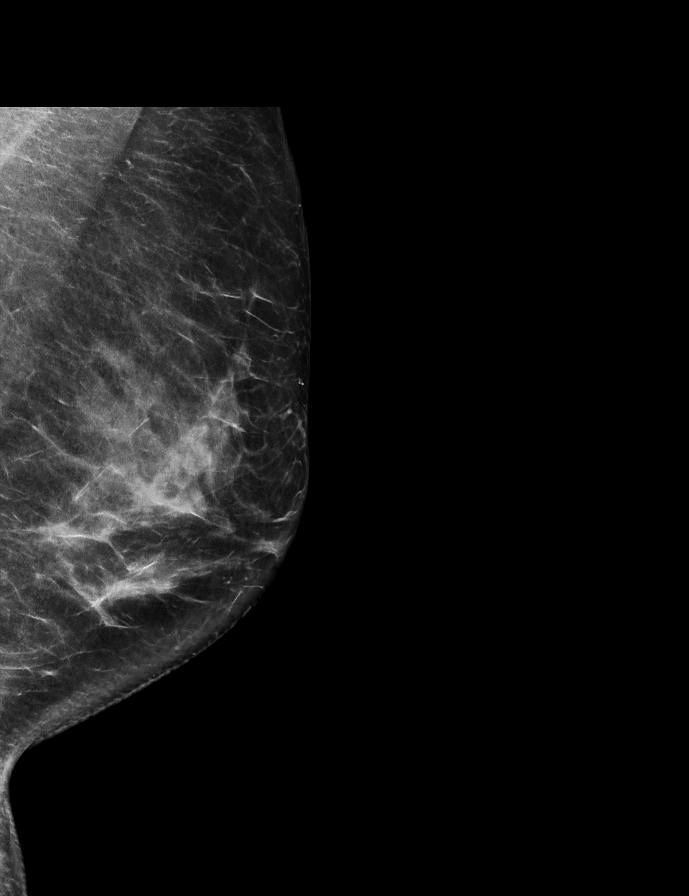

[R MLO synth-2D]
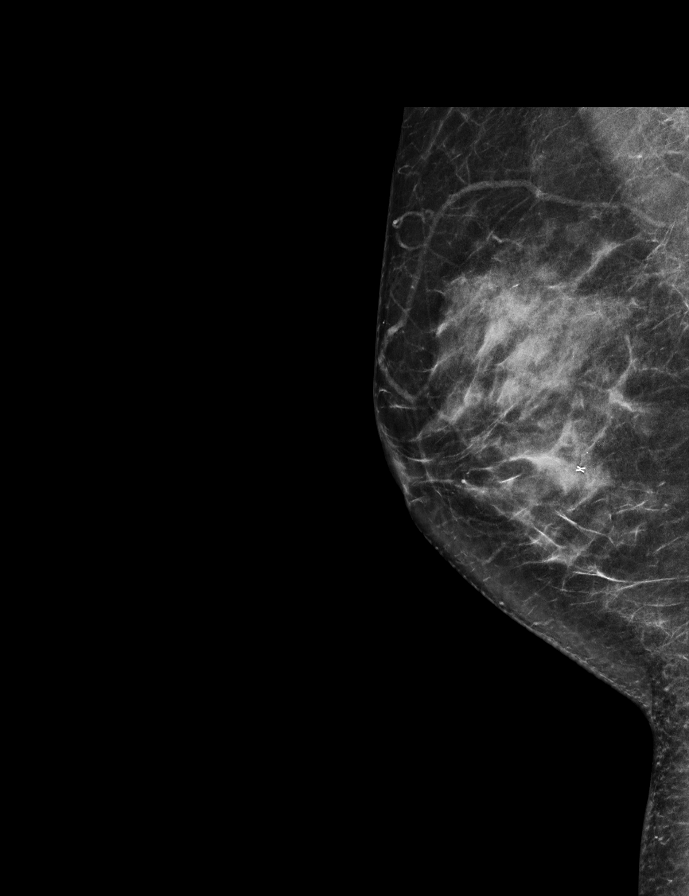

[R MLO tomo · 2 of 65 frames shown]
[frame 21/65]
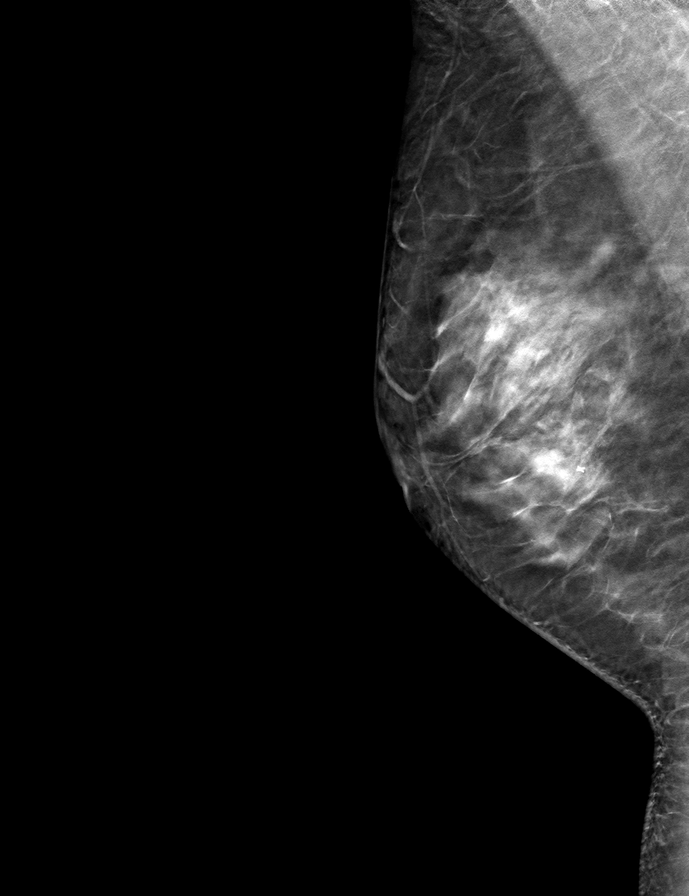
[frame 33/65]
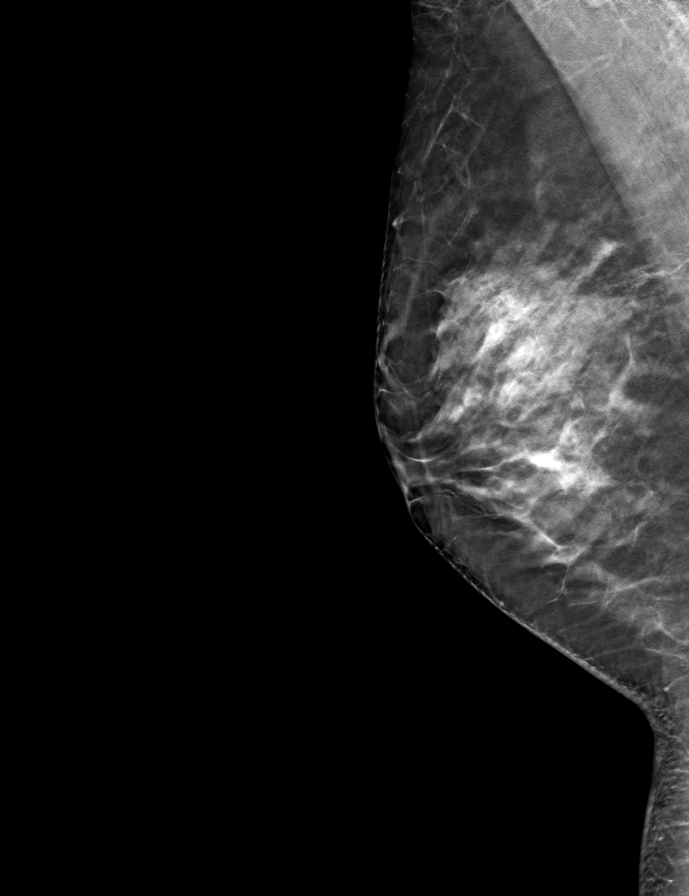

[L CC tomo · tomo slice 33/65.0]
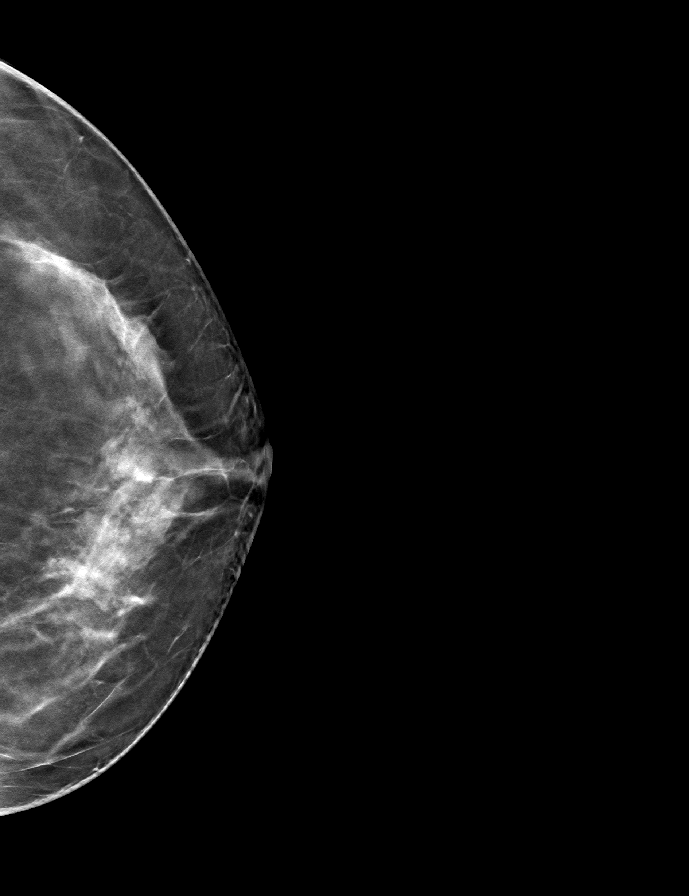

[L MLO tomo · tomo slice 36/71.0]
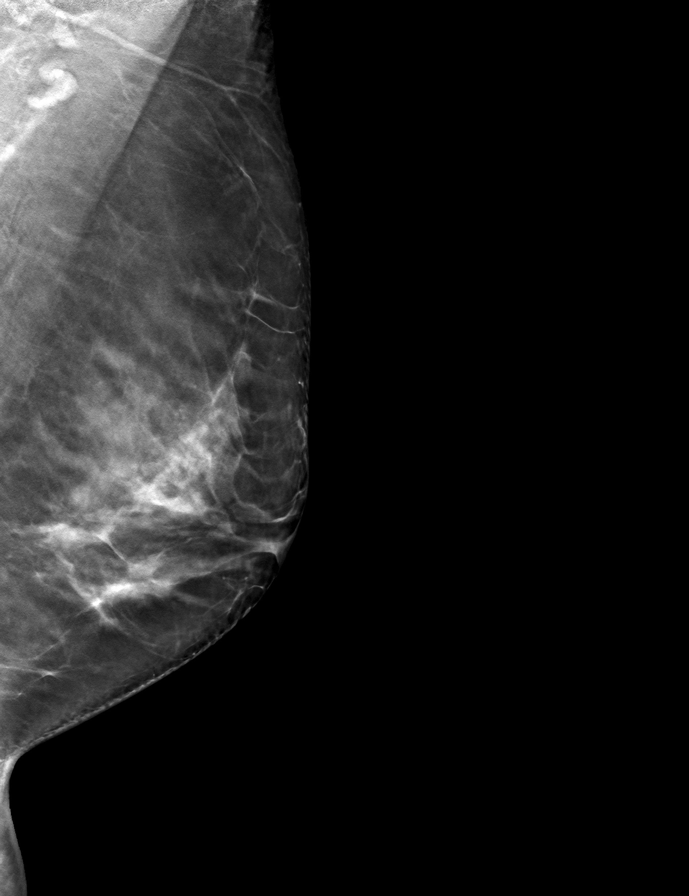

[R CC tomo · tomo slice 33/65.0]
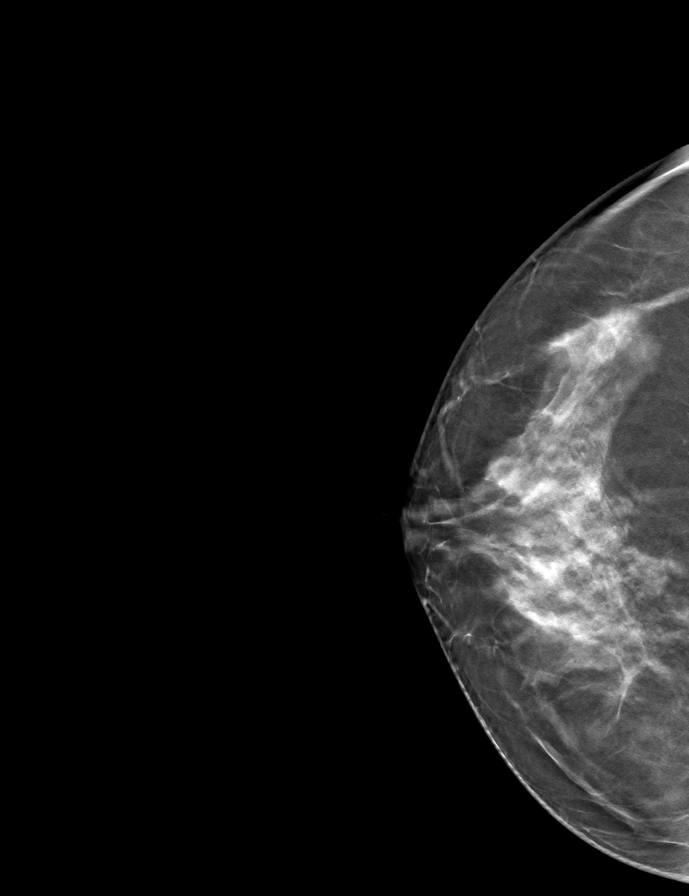

[9 of 24 positions shown; findings below may reference images not displayed]

ACR Breast Density Category c: The breast tissue is heterogeneously
dense, which may obscure small masses.
FINDINGS: There are no findings suspicious for malignancy.
IMPRESSION: No mammographic evidence of malignancy. A result letter of this
screening mammogram will be mailed directly to the patient.

RECOMMENDATION:
Screening mammogram in one year. (Code:Q3-W-BC3)

BI-RADS CATEGORY  1: Negative.

## 2022-12-04 ENCOUNTER — Other Ambulatory Visit: Payer: Self-pay | Admitting: Obstetrics and Gynecology

## 2022-12-04 DIAGNOSIS — Z0000 Encounter for general adult medical examination without abnormal findings: Secondary | ICD-10-CM

## 2023-02-04 ENCOUNTER — Ambulatory Visit: Payer: 59

## 2023-02-04 ENCOUNTER — Ambulatory Visit
Admission: RE | Admit: 2023-02-04 | Discharge: 2023-02-04 | Disposition: A | Discharge status: Home / Self Care | Payer: 59 | Source: Ambulatory Visit | Attending: Obstetrics and Gynecology | Admitting: Obstetrics and Gynecology

## 2023-02-04 DIAGNOSIS — Z0000 Encounter for general adult medical examination without abnormal findings: Secondary | ICD-10-CM

## 2023-10-21 ENCOUNTER — Other Ambulatory Visit: Payer: Self-pay | Admitting: Obstetrics and Gynecology

## 2023-10-21 DIAGNOSIS — Z1231 Encounter for screening mammogram for malignant neoplasm of breast: Secondary | ICD-10-CM

## 2024-02-05 ENCOUNTER — Ambulatory Visit: Payer: 59

## 2024-02-05 ENCOUNTER — Ambulatory Visit
Admission: RE | Admit: 2024-02-05 | Discharge: 2024-02-05 | Disposition: A | Discharge status: Home / Self Care | Source: Ambulatory Visit | Attending: Obstetrics and Gynecology | Admitting: Obstetrics and Gynecology

## 2024-02-05 DIAGNOSIS — Z1231 Encounter for screening mammogram for malignant neoplasm of breast: Secondary | ICD-10-CM
